# Patient Record
Sex: Female | Born: 1970 | Race: White | Hispanic: No | Marital: Married | Smoking: Light tobacco smoker
Health system: Southern US, Community
[De-identification: ages and names within clinical notes are randomized; demographics above are authoritative.]

## PROBLEM LIST (undated history)

## (undated) DIAGNOSIS — Z87442 Personal history of urinary calculi: Secondary | ICD-10-CM

## (undated) DIAGNOSIS — N289 Disorder of kidney and ureter, unspecified: Secondary | ICD-10-CM

## (undated) DIAGNOSIS — D0511 Intraductal carcinoma in situ of right breast: Secondary | ICD-10-CM

## (undated) DIAGNOSIS — E785 Hyperlipidemia, unspecified: Secondary | ICD-10-CM

## (undated) HISTORY — PX: WISDOM TOOTH EXTRACTION: SHX21

---

## 2018-07-24 ENCOUNTER — Other Ambulatory Visit: Payer: Self-pay

## 2018-07-24 ENCOUNTER — Ambulatory Visit: Payer: BLUE CROSS/BLUE SHIELD | Attending: Obstetrics and Gynecology

## 2018-07-24 DIAGNOSIS — E785 Hyperlipidemia, unspecified: Secondary | ICD-10-CM | POA: Insufficient documentation

## 2018-07-24 DIAGNOSIS — M62838 Other muscle spasm: Secondary | ICD-10-CM | POA: Diagnosis not present

## 2018-07-24 DIAGNOSIS — R293 Abnormal posture: Secondary | ICD-10-CM | POA: Insufficient documentation

## 2018-07-24 NOTE — Therapy (Signed)
[facility] [address] [place], [place], [address] Phone: [phone]   Fax:  [phone]  Physical Therapy Evaluation  The patient has been informed of current processes in place at Outpatient Rehab to protect patients from Covid-19 exposure including social distancing, schedule modifications, and new cleaning procedures. After discussing their particular risk with a therapist based on the patient's personal risk factors, the patient has decided to proceed with in-person therapy.   Patient Details  Name: Marisa Figueroa MRN: [id number] Date of Birth: 02/08/1970 No data recorded  Encounter Date: 07/24/2018    History reviewed. No pertinent past medical history.  History reviewed. No pertinent surgical history.  There were no vitals filed for this visit.     Pelvic Floor Physical Therapy Evaluation and Assessment  SCREENING  Falls in last 6 mo: no  Patient's communication preference:   Red Flags:  Have you had any night sweats? occasionally Unexplained weight loss? no Saddle anesthesia? no Unexplained changes in bowel or bladder habits? no  SUBJECTIVE  Patient reports: Having pain with intercourse for ~ 1 year since she entered menopause. Pain is both at opening and deeper.   Precautions:  none  Social/Family/Vocational History:   Massage therapist previously, is a trucker now.  Recent Procedures/Tests/Findings:  none  Obstetrical History: none  Gynecological History: Had an IUD, first time it went well, second time it did not go well, "everything went down hill from there"   Urinary History: Occasional UUI, mild SUI with prolonged coughing  Gastrointestinal History: No issues  Sexual activity/pain: Pain with both penetration and thrusting.   Location of pain: vaginal with intercourse  Current pain:  0/10  Max pain:  8/10 Least pain:  0/10 Nature of pain:  Sharp/ ache  Patient Goals: Be  able to have intercourse without pain.   OBJECTIVE  Posture/Observations:  Sitting:  Standing: L PSIS high, L hip high, L knee bent  Palpation/Segmental Motion/Joint Play: TTP to B hip-flexors, lumbar extensors, hip extensors, OI  Special tests:   Leg-length: LLE long by 1~cm  Range of Motion/Flexibilty:  Spine: L SB ~ 1.5 fingers from knee, pinch on L, To R: to knee, stretch on L. Decreased by ~ 25% B for rotation, has more feeling of tightness with L rotation. Hips:   Strength/MMT: Deferred to follow-up! LE MMT  LE MMT Left Right  Hip flex:  (L2) /5 /5  Hip ext: /5 /5  Hip abd: /5 /5  Hip add: /5 /5  Hip IR /5 /5  Hip ER /5 /5     Abdominal:  Palpation: TTP to B hip-flexors and adductors Diastasis: not assessed  Pelvic Floor External Exam: deferred to follow-up when deemed necessary Introitus Appears:  Skin integrity:  Palpation: Cough: Prolapse visible?: Scar mobility:  Internal Vaginal Exam: Strength (PERF):  Symmetry: Palpation: Prolapse:   Internal Rectal Exam: Strength (PERF): Symmetry: Palpation: Prolapse:   Gait Analysis: Deferred to follow-up  Pelvic Floor Outcome Measures: Given to Pt to return at visit #2  INTERVENTIONS THIS SESSION: Self-care: Educated on the structure and function of the pelvic floor in relation to their symptoms as well as the POC, and initial HEP in order to set patient expectations and understanding from which we will build on in the future sessions. G[place]eng heel-lift and education on how to gradually increase. Therex: Educated on and practiced hip-flexor stretch To maintain and improve muscle length and allow for improved balance of musculature for long-term symptom relief.  Total time: 60 min.                Objective measurements completed on examination: See above findings.                PT Short Term Goals - 07/26/18 0844      PT SHORT TERM GOAL #1   Title  Patient will  demonstrate improved pelvic alignment and balance of musculature surrounding the pelvis to facilitate decreased PFM spasms and decrease pelvic pain.    Baseline  LLE long, Spasms surrounding pelvis    Time  5    Period  Weeks    Status  New    Target Date  08/30/18      PT SHORT TERM GOAL #2   Title  Patient will demonstrate a coordinated contraction, relaxation, and bulge of the pelvic floor muscles to demonstrate functional recruitment and motion and allow for improved function.    Baseline  Pt. having pain with intercourse and UUI, indicating poor coordination and spasms through PFM    Time  5    Period  Weeks    Status  New    Target Date  08/30/18      PT SHORT TERM GOAL #3   Title  Patient will demonstrate HEP x1 in the clinic to demonstrate understanding and proper form to allow for further improvement.    Baseline  Pt. lacks understanding of therapeutic exercises that will decrease pain.    Time  5    Period  Weeks    Status  New    Target Date  08/30/18        PT Long Term Goals - 07/26/18 0848      PT LONG TERM GOAL #1   Title  Patient will report no episodes of SUI/UUI over the course of the prior two weeks to demonstrate improved functional ability.    Baseline  Pt. having mild leakage when she "runs" to the bathroom, SUI with prolonged coughing    Time  10    Period  Weeks    Status  New    Target Date  10/04/18      PT LONG TERM GOAL #2   Title  Patient will report no pain with intercourse to demonstrate improved functional ability    Baseline  Having pain with both initial penetration and deep thrusting    Time  10    Period  Weeks    Status  New    Target Date  10/04/18      PT LONG TERM GOAL #3   Title  Patient will score less than or equal to **% on the Female NIH-CPSI and ** on the VQ to demonstrate a reduction in pain, urinary symptoms, and an improved quality of life.    Baseline  To be updated at session 2    Time  10    Period  Weeks    Status   New    Target Date  10/04/18               Patient will benefit from skilled therapeutic intervention in order to improve the following deficits and impairments:  Decreased mobility, Hypomobility, Increased muscle spasms, Decreased activity tolerance, Increased fascial restricitons, Pain, Postural dysfunction  Visit Diagnosis: 1. Other muscle spasm   2. Abnormal posture        Problem List Patient Active Problem List   Diagnosis Date Noted  . Hyperlipemia 07/24/2018   Delfin EdisKeeli  Angeline Slim DPT, ATC Willa Rough 07/26/2018, 8:53 AM  [place] MAIN [facility] SERVICES [address] [place], [place], [address] Phone: [phone]   Fax:  [phone]  Name: Marisa Figueroa MRN: [id number] Date of Birth: 1970-06-24

## 2018-07-24 NOTE — Patient Instructions (Signed)
Flexors, Lunge  Hip Flexor Stretch: Proposal Pose    Maintain pelvic tuck under, lift pubic bone toward navel. Engage posterior hip muscles (firm glute muscles of leg in back position) and shift forward until you feel stretch on front of leg that is down. To increase stretch, maintain balance and ease hips forward. You may use one hand on a chair for balance if needed. Hold for __5__ breaths. Repeat __2-3__ times each leg.  Do _1-2__ times per day.   As you start wearing your heel-lift only wear it for an hour the first day and increase by an hour each day so you can allow for the body to adapt to the change easily without much pain. If your pain increases by more than 1-2 points, back off slightly or slow down how quickly you increase your wear time. Once you reach a full day of wear, use it as much as possible forever, even in house-shoes or flip-flops if necessary to keep yourself from reverting to bad pelvic and spinal alignment and having symptoms return.    Adjust-a-lift heel lift Can be found at [email or web address]

## 2018-07-31 ENCOUNTER — Ambulatory Visit: Payer: BLUE CROSS/BLUE SHIELD

## 2018-07-31 ENCOUNTER — Other Ambulatory Visit: Payer: Self-pay

## 2018-07-31 DIAGNOSIS — R293 Abnormal posture: Secondary | ICD-10-CM

## 2018-07-31 DIAGNOSIS — M62838 Other muscle spasm: Secondary | ICD-10-CM

## 2018-07-31 NOTE — Therapy (Signed)
[facility] [address] [place], [place], [address] Phone: [phone]   Fax:  [phone]  Physical Therapy Treatment  The patient has been informed of current processes in place at Outpatient Rehab to protect patients from Covid-19 exposure including social distancing, schedule modifications, and new cleaning procedures. After discussing their particular risk with a therapist based on the patient's personal risk factors, the patient has decided to proceed with in-person therapy.   Patient Details  Name: Marisa Figueroa MRN: [id number] Date of Birth: 06/20/1970 No data recorded  Encounter Date: 07/31/2018  PT End of Session - 08/01/18 1106    Visit Number  2    Number of Visits  10    Date for PT Re-Evaluation  10/02/18    Authorization Type  BCBS high-deductible    Authorization Time Period  10/02/2018    Authorization - Visit Number  2    Authorization - Number of Visits  10    PT Start Time  1530    PT Stop Time  1630    PT Time Calculation (min)  60 min    Activity Tolerance  Patient tolerated treatment well;No increased pain;Other (comment)   Pt. became teary when discussing sexual function   Behavior During Therapy  [facility] for tasks assessed/performed       No past medical history on file.  No past surgical history on file.  There were no vitals filed for this visit.    Pelvic Floor Physical Therapy Treatment Note  SCREENING  Changes in medications, allergies, or medical history?: none    SUBJECTIVE  Patient reports: Has not tried intercourse. Wore heel-lift for ~ 30 min. Each day.  Precautions:  none  Pain update:  Location of pain: Vaginal with intercourse Current pain:  0/10  Max pain:  8/10 Least pain:  0/10 Nature of pain: sharp/ache  Patient Goals: Be able to have intercourse without pain.   OBJECTIVE  Changes in: Posture/Observations:  Hyperlordotic  Abdominal:  Weak  TA  Palpation: TTP to B hip-flexors, L>R  Gait Analysis: Decreased hip EXT B  INTERVENTIONS THIS SESSION: Self-care: reviewed how heel-lift should be worn to see improvement. Therex: educated on and practiced pelvic tilts in seated to increase recruitment and strength of TA to decrease hip-flexor spasm and improve deep-core stability. Manual: Performed TP release to B Iliacus and Psoas to decrease spasm and pain and allow for improved balance of musculature for improved function and decreased symptoms.   Total time: 60 min.                            PT Short Term Goals - 07/26/18 0844      PT SHORT TERM GOAL #1   Title  Patient will demonstrate improved pelvic alignment and balance of musculature surrounding the pelvis to facilitate decreased PFM spasms and decrease pelvic pain.    Baseline  LLE long, Spasms surrounding pelvis    Time  5    Period  Weeks    Status  New    Target Date  08/30/18      PT SHORT TERM GOAL #2   Title  Patient will demonstrate a coordinated contraction, relaxation, and bulge of the pelvic floor muscles to demonstrate functional recruitment and motion and allow for improved function.    Baseline  Pt. having pain with intercourse and UUI, indicating poor coordination and spasms through PFM  Time  5    Period  Weeks    Status  New    Target Date  08/30/18      PT SHORT TERM GOAL #3   Title  Patient will demonstrate HEP x1 in the clinic to demonstrate understanding and proper form to allow for further improvement.    Baseline  Pt. lacks understanding of therapeutic exercises that will decrease pain.    Time  5    Period  Weeks    Status  New    Target Date  08/30/18        PT Long Term Goals - 07/26/18 0848      PT LONG TERM GOAL #1   Title  Patient will report no episodes of SUI/UUI over the course of the prior two weeks to demonstrate improved functional ability.    Baseline  Pt. having mild leakage when she "runs"  to the bathroom, SUI with prolonged coughing    Time  10    Period  Weeks    Status  New    Target Date  10/04/18      PT LONG TERM GOAL #2   Title  Patient will report no pain with intercourse to demonstrate improved functional ability    Baseline  Having pain with both initial penetration and deep thrusting    Time  10    Period  Weeks    Status  New    Target Date  10/04/18      PT LONG TERM GOAL #3   Title  Patient will score less than or equal to **% on the Female NIH-CPSI and ** on the VQ to demonstrate a reduction in pain, urinary symptoms, and an improved quality of life.    Baseline  To be updated at session 2    Time  10    Period  Weeks    Status  New    Target Date  10/04/18            Plan - 08/01/18 1107    Clinical Impression Statement  Pt. Responded well though slowly to all interventions today, demonstrating improved hip-flexor EXT ROM and decreased spasm as well as understanding and correct performance of all education and exercises provided today. They will continue to benefit from skilled physical therapy to work toward remaining goals and maximize function as well as decrease likelihood of symptom increase or recurrence.     PT Next Visit Plan  Perform TP release vs. DN to Low back, L QL and mobility to sacrum    PT Home Exercise Plan  hip-flexor stretch, Pelvic tilts in seated    Consulted and Agree with Plan of Care  Patient       Patient will benefit from skilled therapeutic intervention in order to improve the following deficits and impairments:     Visit Diagnosis: 1. Other muscle spasm   2. Abnormal posture        Problem List Patient Active Problem List   Diagnosis Date Noted  . Hyperlipemia 07/24/2018   Willa Rough DPT, ATC Willa Rough 08/01/2018, 11:08 AM  [place] MAIN [facility] SERVICES [address] [place], [place], [address] Phone: [phone]   Fax:  [phone]  Name: Marisa Figueroa MRN: [id number] Date of Birth: [birth year]

## 2018-07-31 NOTE — Patient Instructions (Signed)
   Do 2 sets of 15 tilts per day. Breathe in when you tilt forward (A) and out when you tuck under (B).  

## 2018-08-07 ENCOUNTER — Other Ambulatory Visit: Payer: Self-pay

## 2018-08-07 ENCOUNTER — Ambulatory Visit: Payer: BLUE CROSS/BLUE SHIELD

## 2018-08-07 DIAGNOSIS — M62838 Other muscle spasm: Secondary | ICD-10-CM | POA: Diagnosis not present

## 2018-08-07 DIAGNOSIS — R293 Abnormal posture: Secondary | ICD-10-CM

## 2018-08-07 NOTE — Patient Instructions (Signed)
[facility]  Marriage counselor in [place], [place]   Online appointments  Address: [place], [place], [place] [address]  Hours:  Open ? Closes 7PM  Updated by business 2 weeks ago   Phone: [phone]  Also look on psychology today website    Relax all the way forward, tucking your hips under just slightly so you feel a stretch through your low back. Hold for 5 deep breaths, repeat 2-3 times, 1-2 times per day.

## 2018-08-07 NOTE — Therapy (Signed)
[place] MAIN [facility] SERVICES [address] [place], [place], [address] Phone: [phone]   Fax:  [phone]  Physical Therapy Treatment  The patient has been informed of current processes in place at Outpatient Rehab to protect patients from Covid-19 exposure including social distancing, schedule modifications, and new cleaning procedures. After discussing their particular risk with a therapist based on the patient's personal risk factors, the patient has decided to proceed with in-person therapy.   Patient Details  Name: Marisa Figueroa MRN: [id number] Date of Birth: [birth year] No data recorded  Encounter Date: 08/07/2018  PT End of Session - 08/07/18 1625    Visit Number  3    Number of Visits  10    Date for PT Re-Evaluation  10/02/18    Authorization Type  BCBS high-deductible    Authorization Time Period  10/02/2018    Authorization - Visit Number  3    Authorization - Number of Visits  10    PT Start Time  4580    PT Stop Time  1630    PT Time Calculation (min)  60 min    Activity Tolerance  Patient tolerated treatment well;No increased pain;Other (comment)   Pt. became teary when discussing sexual function   Behavior During Therapy  [facility] for tasks assessed/performed       No past medical history on file.  No past surgical history on file.  There were no vitals filed for this visit.    Pelvic Floor Physical Therapy Treatment Note  SCREENING  Changes in medications, allergies, or medical history?: none    SUBJECTIVE  Patient reports: Is enjoying the pelvic tilts but has been still having hip tightness with stretches.   Precautions:  none  Pain update:  Location of pain: Vaginal with intercourse Current pain: 0/10  Max pain: 8/10 Least pain: 0/10 Nature of pain:sharp/ache  Patient Goals: Be able to have intercourse without pain.   OBJECTIVE  Changes in: Posture/Observations:  Pt. Became teary when  discussing the  impact of emotions on PFM dysfunction/vaginismus    Palpation: TTP to B Iliacus and lumbar paraspinals, multifidus and L>R QL.  Gait Analysis: Decreased stride length, anterior pelvic tilt.  INTERVENTIONS THIS SESSION: Self-care: Educated on the Female arousal cycle and how tied into our mental state it is. Encouraged Pt. To seek mental health counseling to help her address the mental/emotional aspect of her dyspareunia as we are addressing the physical aspect. Manual: Performed TP release and STM to B lumbar paraspinals to decrease spasm and pain and allow for improved balance of musculature for improved function and decreased symptoms. Dry-needle: Performed TPDN with .30x61mm needle and standard approach as described below to decrease spasm and pain and allow for improved balance of musculature for improved function and decreased symptoms. Therex: Educated on and practiced low back stretch To maintain and improve muscle length and allow for improved balance of musculature for long-term symptom relief.  Total time: 60 min.                    Trigger Point Dry Needling - 08/07/18 0001    Consent Given?  Yes    Education Handout Provided  No    Muscles Treated Back/Hip  Erector spinae;Lumbar multifidi    Dry Needling Comments  Bilateral    Erector spinae Response  Twitch response elicited;Palpable increased muscle length    Lumbar multifidi Response  Twitch response elicited;Palpable increased muscle length  PT Short Term Goals - 07/26/18 0844      PT SHORT TERM GOAL #1   Title  Patient will demonstrate improved pelvic alignment and balance of musculature surrounding the pelvis to facilitate decreased PFM spasms and decrease pelvic pain.    Baseline  LLE long, Spasms surrounding pelvis    Time  5    Period  Weeks    Status  New    Target Date  08/30/18      PT SHORT TERM GOAL #2   Title  Patient will demonstrate a coordinated  contraction, relaxation, and bulge of the pelvic floor muscles to demonstrate functional recruitment and motion and allow for improved function.    Baseline  Pt. having pain with intercourse and UUI, indicating poor coordination and spasms through PFM    Time  5    Period  Weeks    Status  New    Target Date  08/30/18      PT SHORT TERM GOAL #3   Title  Patient will demonstrate HEP x1 in the clinic to demonstrate understanding and proper form to allow for further improvement.    Baseline  Pt. lacks understanding of therapeutic exercises that will decrease pain.    Time  5    Period  Weeks    Status  New    Target Date  08/30/18        PT Long Term Goals - 07/26/18 0848      PT LONG TERM GOAL #1   Title  Patient will report no episodes of SUI/UUI over the course of the prior two weeks to demonstrate improved functional ability.    Baseline  Pt. having mild leakage when she "runs" to the bathroom, SUI with prolonged coughing    Time  10    Period  Weeks    Status  New    Target Date  10/04/18      PT LONG TERM GOAL #2   Title  Patient will report no pain with intercourse to demonstrate improved functional ability    Baseline  Having pain with both initial penetration and deep thrusting    Time  10    Period  Weeks    Status  New    Target Date  10/04/18      PT LONG TERM GOAL #3   Title  Patient will score less than or equal to **% on the Female NIH-CPSI and ** on the VQ to demonstrate a reduction in pain, urinary symptoms, and an improved quality of life.    Baseline  To be updated at session 2    Time  10    Period  Weeks    Status  New    Target Date  10/04/18            Plan - 08/07/18 1626    Clinical Impression Statement  Pt. Responded well to all interventions today, demonstrating improved relaxation of lumbar multifidus, with Pt. Report of feeling relaxation into lower abdomen and pelvis as well as understanding and correct performance of all education and  exercises provided today. They will continue to benefit from skilled physical therapy to work toward remaining goals and maximize function as well as decrease likelihood of symptom increase or recurrence.     PT Next Visit Plan  Perform TP release vs. DN to B hip-flexors and adductors, L QL and mobility to sacrum    PT Home Exercise Plan  hip-flexor stretch, Pelvic tilts in  seated, LB stretch    Recommended Other Services  Sex Counselling    Consulted and Agree with Plan of Care  Patient       Patient will benefit from skilled therapeutic intervention in order to improve the following deficits and impairments:     Visit Diagnosis: 1. Other muscle spasm   2. Abnormal posture        Problem List Patient Active Problem List   Diagnosis Date Noted  . Hyperlipemia 07/24/2018   Cleophus MoltKeeli T. Gailes DPT, ATC Cleophus MoltKeeli T Gailes 08/07/2018, 5:04 PM  [place] [facility] [address] [place], [place], [address] Phone: [phone]   Fax:  [phone]  Name: Marisa GrandchildStephanie Figueroa MRN: [id number] Date of Birth: 09/17/1970

## 2018-08-14 ENCOUNTER — Ambulatory Visit: Payer: BLUE CROSS/BLUE SHIELD | Attending: Obstetrics and Gynecology

## 2018-08-14 ENCOUNTER — Other Ambulatory Visit: Payer: Self-pay

## 2018-08-14 DIAGNOSIS — M62838 Other muscle spasm: Secondary | ICD-10-CM | POA: Diagnosis not present

## 2018-08-14 DIAGNOSIS — R293 Abnormal posture: Secondary | ICD-10-CM

## 2018-08-14 NOTE — Patient Instructions (Signed)
   Let the top arm rest on your side (different from pictured) , and slide along the torso as you rotate. Breathe in as you come forward, out as you open up. Do 2x15 on each side.

## 2018-08-14 NOTE — Therapy (Signed)
[place] MAIN [facility] SERVICES [address] [place], [place], [address] Phone: [phone]   Fax:  [phone]  Physical Therapy Treatment  The patient has been informed of current processes in place at Outpatient Rehab to protect patients from Covid-19 exposure including social distancing, schedule modifications, and new cleaning procedures. After discussing their particular risk with a therapist based on the patient's personal risk factors, the patient has decided to proceed with in-person therapy.   Patient Details  Name: Cullen Vanallen MRN: [id number] Date of Birth: [birth year] No data recorded  Encounter Date: 08/14/2018  PT End of Session - 08/15/18 0814    Visit Number  4    Number of Visits  10    Date for PT Re-Evaluation  10/02/18    Authorization Type  BCBS high-deductible    Authorization Time Period  10/02/2018    Authorization - Visit Number  4    Authorization - Number of Visits  10    PT Start Time  1950    PT Stop Time  1630    PT Time Calculation (min)  60 min    Activity Tolerance  Patient tolerated treatment well;No increased pain;Other (comment)   Pt. became teary when discussing sexual function   Behavior During Therapy  [facility] for tasks assessed/performed       No past medical history on file.  No past surgical history on file.  There were no vitals filed for this visit.     Pelvic Floor Physical Therapy Treatment Note  SCREENING  Changes in medications, allergies, or medical history?: none    SUBJECTIVE  Patient reports: Is doing well this week, has been doing stretches, is wanting to add yoga to her practice. Recognizing that self-care is very important and she has to do it. Has not looked into the resources yet about counselling.   Precautions:  none  Pain update:  Location of pain: Vaginal with intercourse Current pain: 0/10  Max pain: 8/10 Least pain: 0/10 Nature of pain:sharp/ache  Patient  Goals: Be able to have intercourse without pain.   OBJECTIVE  Changes in: Posture/Observations:  Decreased rot ROM B by ~ 25%, with pain on R>L with rot.   Palpation: TTP to B Iliacus and Psoas.   INTERVENTIONS THIS SESSION: Manual: Performed TP release and STM to B Iliacus and Psoas to decrease spasm and pain and allow for improved balance of musculature for improved function and decreased symptoms. Dry-needle: Performed TPDN with .30x45mm needle and standard approach as described below to decrease spasm and pain and allow for improved balance of musculature for improved function and decreased symptoms. Therex: Educated on and practiced bow-and-arrow to improve mobility of spine and surrounding connective tissue and decrease pressure on nerve roots for improved conductivity and function of down-stream tissues.   Total time: 60 min.                         Trigger Point Dry Needling - 08/15/18 0001    Consent Given?  Yes    Education Handout Provided  No    Muscles Treated Back/Hip  Iliacus    Dry Needling Comments  Bilateral    Iliacus Response  Twitch response elicited;Palpable increased muscle length             PT Short Term Goals - 07/26/18 0844      PT SHORT TERM GOAL #1   Title  Patient will demonstrate improved pelvic  alignment and balance of musculature surrounding the pelvis to facilitate decreased PFM spasms and decrease pelvic pain.    Baseline  LLE long, Spasms surrounding pelvis    Time  5    Period  Weeks    Status  New    Target Date  08/30/18      PT SHORT TERM GOAL #2   Title  Patient will demonstrate a coordinated contraction, relaxation, and bulge of the pelvic floor muscles to demonstrate functional recruitment and motion and allow for improved function.    Baseline  Pt. having pain with intercourse and UUI, indicating poor coordination and spasms through PFM    Time  5    Period  Weeks    Status  New    Target Date   08/30/18      PT SHORT TERM GOAL #3   Title  Patient will demonstrate HEP x1 in the clinic to demonstrate understanding and proper form to allow for further improvement.    Baseline  Pt. lacks understanding of therapeutic exercises that will decrease pain.    Time  5    Period  Weeks    Status  New    Target Date  08/30/18        PT Long Term Goals - 07/26/18 0848      PT LONG TERM GOAL #1   Title  Patient will report no episodes of SUI/UUI over the course of the prior two weeks to demonstrate improved functional ability.    Baseline  Pt. having mild leakage when she "runs" to the bathroom, SUI with prolonged coughing    Time  10    Period  Weeks    Status  New    Target Date  10/04/18      PT LONG TERM GOAL #2   Title  Patient will report no pain with intercourse to demonstrate improved functional ability    Baseline  Having pain with both initial penetration and deep thrusting    Time  10    Period  Weeks    Status  New    Target Date  10/04/18      PT LONG TERM GOAL #3   Title  Patient will score less than or equal to **% on the Female NIH-CPSI and ** on the VQ to demonstrate a reduction in pain, urinary symptoms, and an improved quality of life.    Baseline  To be updated at session 2    Time  10    Period  Weeks    Status  New    Target Date  10/04/18            Plan - 08/15/18 0815    Clinical Impression Statement  Pt. Responded well to all interventions today, demonstrating improved upright posture, hip EXT ROM, and decreased spasm as well as understanding and correct performance of all education and exercises provided today. They will continue to benefit from skilled physical therapy to work toward remaining goals and maximize function as well as decrease likelihood of symptom increase or recurrence.    PT Next Visit Plan  Perform TP release vs. DN to B Psoas? and adductors, L QL and mobility to sacrum    PT Home Exercise Plan  hip-flexor stretch, Pelvic  tilts in seated, LB stretch, bow-and-arrow    Consulted and Agree with Plan of Care  Patient       Patient will benefit from skilled therapeutic intervention in order to improve the  following deficits and impairments:     Visit Diagnosis: 1. Other muscle spasm   2. Abnormal posture        Problem List Patient Active Problem List   Diagnosis Date Noted  . Hyperlipemia 07/24/2018   Cleophus MoltKeeli T. Mikyla Schachter DPT, ATC Cleophus MoltKeeli T Gurfateh Mcclain 08/15/2018, 8:17 AM  [place] [facility] [address] [place], [place], [address] Phone: [phone]   Fax:  [phone]  Name: Etta GrandchildStephanie Birr MRN: [id number] Date of Birth: 06/04/1970

## 2018-08-21 ENCOUNTER — Ambulatory Visit: Payer: BLUE CROSS/BLUE SHIELD

## 2018-08-22 ENCOUNTER — Ambulatory Visit: Payer: BLUE CROSS/BLUE SHIELD

## 2018-08-22 ENCOUNTER — Other Ambulatory Visit: Payer: Self-pay

## 2018-08-22 DIAGNOSIS — M62838 Other muscle spasm: Secondary | ICD-10-CM | POA: Diagnosis not present

## 2018-08-22 DIAGNOSIS — R293 Abnormal posture: Secondary | ICD-10-CM

## 2018-08-22 NOTE — Therapy (Signed)
[facility] [address] [place], [place], [address] Phone: [phone]   Fax:  [phone]  Physical Therapy Treatment   The patient has been informed of current processes in place at Outpatient Rehab to protect patients from Covid-19 exposure including social distancing, schedule modifications, and new cleaning procedures. After discussing their particular risk with a therapist based on the patient's personal risk factors, the patient has decided to proceed with in-person therapy.   Patient Details  Name: Marisa Figueroa MRN: [id number] Date of Birth: 01/30/1970 No data recorded  Encounter Date: 08/22/2018  PT End of Session - 08/23/18 0817    Visit Number  5    Number of Visits  10    Date for PT Re-Evaluation  10/02/18    Authorization Type  BCBS high-deductible    Authorization Time Period  10/02/2018    Authorization - Visit Number  5    Authorization - Number of Visits  10    PT Start Time  1735    PT Stop Time  1835    PT Time Calculation (min)  60 min    Activity Tolerance  Patient tolerated treatment well;Other (comment)   Pt. became teary when discussing sexual function   Behavior During Therapy  [facility] for tasks assessed/performed       No past medical history on file.  No past surgical history on file.  There were no vitals filed for this visit.    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 She has been working a lot lately.  Precautions:  none  Pain update:  Location of pain: Vaginal with intercourse back/sides Current pain: 6/10  Max pain: 8/10 Least pain: 0/10 Nature of pain:sharp/ache  Patient Goals: Be able to have intercourse without pain.   OBJECTIVE  Changes in:   Pelvic  Floor External Exam: Introitus Appears: elevated Skin integrity: WNL Palpation: TTP through R bulbo, B STP and IC. Cough: no motion Prolapse visible?: no Scar mobility: N/A  Internal Vaginal Exam: Strength (PERF): not assessed Symmetry: R>L for TTP Palpation: TTP throughout Prolapse: none     INTERVENTIONS THIS SESSION: Manual: assessed PFM and educated husband on how to perfomr internal TP release and Pt. On how to perform self external TP release to decrease spasm and pain and allow for improved balance of musculature for improved function and decreased symptoms. Self-care: educated both Pt. And her husband on the multiple systems that are involved with the heightened spasm/pain and sensitivity of the pelvis and how to use a multi-pronged approach to treat all aspects. Educated on awakenings sexual therapy in [place].  Total time: 60 min.                             PT Short Term Goals - 07/26/18 0844      PT SHORT TERM GOAL #1   Title  Patient will demonstrate improved pelvic alignment and balance of musculature surrounding the pelvis to facilitate decreased PFM spasms and decrease pelvic pain.    Baseline  LLE long, Spasms surrounding pelvis    Time  5    Period  Weeks    Status  New    Target Date  08/30/18  PT SHORT TERM GOAL #2   Title  Patient will demonstrate a coordinated contraction, relaxation, and bulge of the pelvic floor muscles to demonstrate functional recruitment and motion and allow for improved function.    Baseline  Pt. having pain with intercourse and UUI, indicating poor coordination and spasms through PFM    Time  5    Period  Weeks    Status  New    Target Date  08/30/18      PT SHORT TERM GOAL #3   Title  Patient will demonstrate HEP x1 in the clinic to demonstrate understanding and proper form to allow for further improvement.    Baseline  Pt. lacks understanding of therapeutic exercises that will decrease pain.     Time  5    Period  Weeks    Status  New    Target Date  08/30/18        PT Long Term Goals - 07/26/18 0848      PT LONG TERM GOAL #1   Title  Patient will report no episodes of SUI/UUI over the course of the prior two weeks to demonstrate improved functional ability.    Baseline  Pt. having mild leakage when she "runs" to the bathroom, SUI with prolonged coughing    Time  10    Period  Weeks    Status  New    Target Date  10/04/18      PT LONG TERM GOAL #2   Title  Patient will report no pain with intercourse to demonstrate improved functional ability    Baseline  Having pain with both initial penetration and deep thrusting    Time  10    Period  Weeks    Status  New    Target Date  10/04/18      PT LONG TERM GOAL #3   Title  Patient will score less than or equal to **% on the Female NIH-CPSI and ** on the VQ to demonstrate a reduction in pain, urinary symptoms, and an improved quality of life.    Baseline  To be updated at session 2    Time  10    Period  Weeks    Status  New    Target Date  10/04/18            Plan - 08/23/18 0818    Clinical Impression Statement  Pt. Responded well to all interventions today, demonstrating understanding of all education and ability to use breathing to help decrease and moderate pain/fear response with digital penetration for exam and TP release training as well as improved ability to communicate effectively with her husband in the context of him assisting her with her treatment. They will continue to benefit from skilled physical therapy to work toward remaining goals and maximize function as well as decrease likelihood of symptom increase or recurrence.     PT Next Visit Plan  Perform TP release vs. DN to B Psoas? and L adductors, L QL and mobility to sacrum Add TA in quad    PT Home Exercise Plan  hip-flexor stretch, Pelvic tilts in seated, LB stretch, bow-and-arrow    Recommended Other Services  sex Counselling    Consulted and  Agree with Plan of Care  Patient       Patient will benefit from skilled therapeutic intervention in order to improve the following deficits and impairments:     Visit Diagnosis: 1. Other muscle spasm   2. Abnormal posture  Problem List Patient Active Problem List   Diagnosis Date Noted  . Hyperlipemia 07/24/2018   Cleophus MoltKeeli T. Antonea Gaut DPT, ATC Cleophus MoltKeeli T Janit Cutter 08/23/2018, 8:21 AM  [facility] [address] [place], [place], [address] Phone: [phone]   Fax:  [phone]  Name: Marisa Figueroa MRN: [id number] Date of Birth: 01/30/1970

## 2018-08-22 NOTE — Patient Instructions (Signed)
Self External Trigger Point Relief    1) Wash your hands and prop yourself up in a way where you can easily reach the vagina. You may wish to have a small hand-held mirror near by.  2) Use the 2 middle fingers to put gentle pressure on the three external pelvic floor muscles and hold pressure and take deep breaths as you allow the tension to release and discomfort to dissipate   3) Repeat the process for any trigger points you find spending between 3-10 minutes on this every 1-2 days until you do not find any more trigger points or you are told otherwise by your therapist.  Partner Internal Trigger Point Relief     1) Wash your hands and position in a way where you can easily reach the vagina. You may wish to sit beside your partner and let them relax their leg onto you.   2) lubricate the index finger using a hypoallergenic lubricant such as "slippery-stuff".   3) Slowly and gently insert your finger into the vagina using deep breaths to allow relaxation of the muscles around the finger.  4) Avoiding the "12 o-clock" region near the urethra, gently press into the the wall of the pelvic floor and hold pressure until you find an area that is tender, use picture above to think about where different muscle fibers are.  5) Areas that are tender are called "trigger points". When you find one hold pressure still, applying just enough pressure to elicit mild discomfort and allow your partner to take deep belly breaths until the discomfort subsides or decreases by at least 50%. After the first point releases, release pressure before moving the finger, then move to different areas of the pelvic floor and feel for other trigger points.   6) Repeat the process for any trigger points you find spending between 3-10 minutes on this per night until you do not find any more trigger points or you are told otherwise by your therapist.

## 2018-08-28 ENCOUNTER — Ambulatory Visit: Payer: BLUE CROSS/BLUE SHIELD

## 2018-09-04 ENCOUNTER — Ambulatory Visit: Payer: BLUE CROSS/BLUE SHIELD

## 2018-09-04 ENCOUNTER — Other Ambulatory Visit: Payer: Self-pay

## 2018-09-04 DIAGNOSIS — M62838 Other muscle spasm: Secondary | ICD-10-CM

## 2018-09-04 DIAGNOSIS — R293 Abnormal posture: Secondary | ICD-10-CM

## 2018-09-04 NOTE — Patient Instructions (Signed)
    Sit at the end of the foam roller or towel roll and lie back with your spine along the length of it. Bring your arms out to the side, palms up, and take _10__ belly breaths.  Next, slide arms up overhead doing a "snow angel" motion as you breathe in and down toward your hips as you breathe out. Do this for _10__ belly breaths.     Swing the leg back at an angle, feeling the firm end feel at your low back/hip joint when you get to the back do 2x15 on the Right, 1x15 on the Left   Exhale and rotate knee out, squeezing in the outer hip. Do 2x15  On both sides.

## 2018-09-04 NOTE — Therapy (Signed)
[facility] [address] [place], [place], [address] Phone: [phone]   Fax:  [phone]  Physical Therapy Treatment  The patient has been informed of current processes in place at Outpatient Rehab to protect patients from Covid-19 exposure including social distancing, schedule modifications, and new cleaning procedures. After discussing their particular risk with a therapist based on the patient's personal risk factors, the patient has decided to proceed with in-person therapy.   Patient Details  Name: Marisa GrandchildStephanie Figueroa MRN: [id number] Date of Birth: 09/01/1970 No data recorded  Encounter Date: 09/04/2018  PT End of Session - 09/04/18 1836    Visit Number  6    Number of Visits  10    Date for PT Re-Evaluation  10/02/18    Authorization Type  BCBS high-deductible    Authorization Time Period  10/02/2018    Authorization - Visit Number  6    Authorization - Number of Visits  10    PT Start Time  1530    PT Stop Time  1630    PT Time Calculation (min)  60 min    Activity Tolerance  Patient tolerated treatment well;Other (comment)   Pt. became teary when discussing sexual function   Behavior During Therapy  [facility] for tasks assessed/performed       No past medical history on file.  No past surgical history on file.  There were no vitals filed for this visit.    Pelvic Floor Physical Therapy Treatment Note  SCREENING  Changes in medications, allergies, or medical history?: none    SUBJECTIVE  Patient reports: Has been really stressed lately, is going to start going to acupuncturist and reiki again and get a pedicure this week. Has not had time to do internal TP release yet with husband. Has not done a lot of exercise lately, has been working on getting her gym area cleaned up. Is going to call the counselor, at least for herself.   Precautions:  none  Pain update:  Location of pain: Vaginal with intercourse  back/sides Current pain: 6/10  Max pain: 8/10 Least pain: 0/10 Nature of pain:sharp/ache  Patient Goals: Be able to have intercourse without pain.   OBJECTIVE  Changes in:   Mobility/ROM: Decreased sacral mobility B and pain radiating up to back and lateral and down during mobs.   Resolved on L, partial improvement on R.   INTERVENTIONS THIS SESSION: Manual: Performed PA mobs to B sacral borders to improve mobility of joint and surrounding connective tissue and decrease pressure on nerve roots for improved conductivity and function of down-stream tissues.  Therex: educated on and practiced clam-shells  And leg-swings to improve sacral mobility and decrease pressure on nerve roots for  Decreased PFM and back spasms. Educated on chest stretch and snow-angels on foam roller to improve upper posture and create positive input through the spine for improved proprioception and overall posture. Self-care: supported Pt. Decision to seek counseling and reiterated the importance of taking care of herself first and being a positive role model for her husband without being reliant on him for her health improvement to allow Pt. To make progress.  Total time: 60 min.                                 PT Short Term Goals - 07/26/18 0844      PT SHORT TERM GOAL #1  Title  Patient will demonstrate improved pelvic alignment and balance of musculature surrounding the pelvis to facilitate decreased PFM spasms and decrease pelvic pain.    Baseline  LLE long, Spasms surrounding pelvis    Time  5    Period  Weeks    Status  New    Target Date  08/30/18      PT SHORT TERM GOAL #2   Title  Patient will demonstrate a coordinated contraction, relaxation, and bulge of the pelvic floor muscles to demonstrate functional recruitment and motion and allow for improved function.    Baseline  Pt. having pain with intercourse and UUI, indicating poor coordination and spasms  through PFM    Time  5    Period  Weeks    Status  New    Target Date  08/30/18      PT SHORT TERM GOAL #3   Title  Patient will demonstrate HEP x1 in the clinic to demonstrate understanding and proper form to allow for further improvement.    Baseline  Pt. lacks understanding of therapeutic exercises that will decrease pain.    Time  5    Period  Weeks    Status  New    Target Date  08/30/18        PT Long Term Goals - 07/26/18 0848      PT LONG TERM GOAL #1   Title  Patient will report no episodes of SUI/UUI over the course of the prior two weeks to demonstrate improved functional ability.    Baseline  Pt. having mild leakage when she "runs" to the bathroom, SUI with prolonged coughing    Time  10    Period  Weeks    Status  New    Target Date  10/04/18      PT LONG TERM GOAL #2   Title  Patient will report no pain with intercourse to demonstrate improved functional ability    Baseline  Having pain with both initial penetration and deep thrusting    Time  10    Period  Weeks    Status  New    Target Date  10/04/18      PT LONG TERM GOAL #3   Title  Patient will score less than or equal to **% on the Female NIH-CPSI and ** on the VQ to demonstrate a reduction in pain, urinary symptoms, and an improved quality of life.    Baseline  To be updated at session 2    Time  10    Period  Weeks    Status  New    Target Date  10/04/18            Plan - 09/04/18 1837    Clinical Impression Statement  Pt. Responded well to all interventions today, demonstrating improved sacral mobility as well as understanding and correct performance of all education and exercises provided today. They will continue to benefit from skilled physical therapy to work toward remaining goals and maximize function as well as decrease likelihood of symptom increase or recurrence.     PT Next Visit Plan  Perform TP release vs. DN to B Psoas? and L adductors, L QL and mobility to R sacrum?  Add TA in  quad    PT Home Exercise Plan  hip-flexor stretch, Pelvic tilts in seated, LB stretch, bow-and-arrow, leg-swings, clam-shells    Consulted and Agree with Plan of Care  Patient       Patient  will benefit from skilled therapeutic intervention in order to improve the following deficits and impairments:     Visit Diagnosis: Other muscle spasm  Abnormal posture     Problem List Patient Active Problem List   Diagnosis Date Noted  . Hyperlipemia 07/24/2018   Cleophus Molt DPT, ATC Cleophus Molt 09/04/2018, 6:38 PM  [facility] MAIN [facility] SERVICES [address] [place], [place], [address] Phone: [phone]   Fax:  [phone]  Name: Marisa Figueroa MRN: [id number] Date of Birth: 25-Nov-1970

## 2018-09-11 ENCOUNTER — Ambulatory Visit: Payer: BLUE CROSS/BLUE SHIELD

## 2018-09-18 ENCOUNTER — Other Ambulatory Visit: Payer: Self-pay

## 2018-09-18 ENCOUNTER — Ambulatory Visit: Payer: BLUE CROSS/BLUE SHIELD | Attending: Obstetrics and Gynecology

## 2018-09-18 DIAGNOSIS — M62838 Other muscle spasm: Secondary | ICD-10-CM | POA: Diagnosis present

## 2018-09-18 DIAGNOSIS — R293 Abnormal posture: Secondary | ICD-10-CM | POA: Insufficient documentation

## 2018-09-18 NOTE — Therapy (Signed)
[place] [facility]IN [facility] SERVICES [address] [place], [place], [address] Phone: [phone]   Fax:  [phone]  Physical Therapy Treatment  The patient has been informed of current processes in place at Outpatient Rehab to protect patients from Covid-19 exposure including social distancing, schedule modifications, and new cleaning procedures. After discussing their particular risk with a therapist based on the patient's personal risk factors, the patient has decided to proceed with in-person therapy.   Patient Details  Name: Marisa Figueroa MRN: [id number] Date of Birth: 1970-09-19 No data recorded  Encounter Date: 09/18/2018  PT End of Session - 09/18/18 1636    Visit Number  7    Number of Visits  10    Date for PT Re-Evaluation  10/02/18    Authorization Type  [employer] high-deductible    Authorization Time Period  10/02/2018    Authorization - Visit Number  7    Authorization - Number of Visits  10    PT Start Time  6962    PT Stop Time  1628    PT Time Calculation (min)  45 min    Activity Tolerance  Patient tolerated treatment well   Pt. became teary when discussing sexual function   Behavior During Therapy  [facility] for tasks assessed/performed       No past medical history on file.  No past surgical history on file.  There were no vitals filed for this visit.   Pelvic Floor Physical Therapy Treatment Note  SCREENING  Changes in medications, allergies, or medical history?: none    SUBJECTIVE  Patient reports: Has had a good week, has done rieki, acupuncture, some stretches, talked to her pastor who also talked to her husband. Able to touch herself externally without pain.  Precautions:  none  Pain update:  Location of pain: Vaginal with intercourse back/sides Current pain: 4/10  Max pain: 4/10 Least pain: 0/10 Nature of pain:sharp/ache  **Following treatment Pt. Had muscle fatigue/soreness in lower abdomen and glutes but  not feeling hip-pain.  Patient Goals: Be able to have intercourse without pain.   OBJECTIVE  Changes in:  Abdominal:  Able to engage TA in seated, by ~ 15 deg. Of trunk extension Pt. Looses pelvic tilt.  Strength:  Pt. Able to preform kneeling squats correctly but dominant quads doing ~ 50% of work.   INTERVENTIONS THIS SESSION: Therex: Educated on and practiced tall-kneeling hip-hinges and modified boat pose to improve strength of muscles opposing tight musculature to allow reciprocal inhibition to improve balance of musculature surrounding the pelvis and improve overall posture for optimal musculature length-tension relationship and function. Self-care: discussed adjunct therapies Pt. Is practicing and reinforced the positive role they are playing. Educated on how self-touch and orgasm can help improve resting muscle tension and decrease overall spasm.  Total time: 45 min.                            PT Short Term Goals - 07/26/18 0844      PT SHORT TERM GOAL #1   Title  Patient will demonstrate improved pelvic alignment and balance of musculature surrounding the pelvis to facilitate decreased PFM spasms and decrease pelvic pain.    Baseline  LLE long, Spasms surrounding pelvis    Time  5    Period  Weeks    Status  New    Target Date  08/30/18      PT SHORT TERM  GOAL #2   Title  Patient will demonstrate a coordinated contraction, relaxation, and bulge of the pelvic floor muscles to demonstrate functional recruitment and motion and allow for improved function.    Baseline  Pt. having pain with intercourse and UUI, indicating poor coordination and spasms through PFM    Time  5    Period  Weeks    Status  New    Target Date  08/30/18      PT SHORT TERM GOAL #3   Title  Patient will demonstrate HEP x1 in the clinic to demonstrate understanding and proper form to allow for further improvement.    Baseline  Pt. lacks understanding of therapeutic  exercises that will decrease pain.    Time  5    Period  Weeks    Status  New    Target Date  08/30/18        PT Long Term Goals - 07/26/18 0848      PT LONG TERM GOAL #1   Title  Patient will report no episodes of SUI/UUI over the course of the prior two weeks to demonstrate improved functional ability.    Baseline  Pt. having mild leakage when she "runs" to the bathroom, SUI with prolonged coughing    Time  10    Period  Weeks    Status  New    Target Date  10/04/18      PT LONG TERM GOAL #2   Title  Patient will report no pain with intercourse to demonstrate improved functional ability    Baseline  Having pain with both initial penetration and deep thrusting    Time  10    Period  Weeks    Status  New    Target Date  10/04/18      PT LONG TERM GOAL #3   Title  Patient will score less than or equal to **% on the Female NIH-CPSI and ** on the VQ to demonstrate a reduction in pain, urinary symptoms, and an improved quality of life.    Baseline  To be updated at session 2    Time  10    Period  Weeks    Status  New    Target Date  10/04/18            Plan - 09/18/18 1809    Clinical Impression Statement  Pt. Responded well to all interventions today, demonstrating improved recruitment of weakened glutes and TA and decreased pain from 4/10 to 0/10 as well as understanding and correct performance of all education and exercises provided today. They will continue to benefit from skilled physical therapy to work toward remaining goals and maximize function as well as decrease likelihood of symptom increase or recurrence.     PT Next Visit Plan  ask about internal TP release and review strengthening exercises, hold-relax lengthening of hip-flexors, give 3-way wall stretches    PT Home Exercise Plan  hip-flexor stretch, Pelvic tilts in seated, LB stretch, bow-and-arrow, leg-swings, clam-shells    Consulted and Agree with Plan of Care  Patient       Patient will benefit from  skilled therapeutic intervention in order to improve the following deficits and impairments:     Visit Diagnosis: Other muscle spasm  Abnormal posture     Problem List Patient Active Problem List   Diagnosis Date Noted  . Hyperlipemia 07/24/2018   Cleophus MoltKeeli T. Gailes DPT, ATC Cleophus MoltKeeli T Gailes 09/18/2018, 6:22 PM  [place]  [facility] [place] MAIN [facility] SERVICES [address] [place], [place], [address] Phone: [phone]   Fax:  [phone]  Name: Marisa Figueroa MRN: [id number] Date of Birth: [birth year]

## 2018-09-18 NOTE — Patient Instructions (Signed)
     Inhale as you lower down gradually releasing the glutes and the lower tummy muscles. As you exhale, gradually increase the tension in the glutes and lower tummy until you reach the top and give an "extra squeeze" at the top. Repeat 10x_3_, _1-3__ Times per day.    Tuck pelvis under and hold as you lean back. Hold for ~ 15-30 seconds or until your muscles fatigue and your posture wants to falter. Repeat until you have built up to 1:30 total of hold. 1-3 times per day.

## 2018-09-25 ENCOUNTER — Ambulatory Visit: Payer: BLUE CROSS/BLUE SHIELD

## 2018-10-02 ENCOUNTER — Ambulatory Visit: Payer: BLUE CROSS/BLUE SHIELD

## 2018-10-09 ENCOUNTER — Ambulatory Visit: Payer: BLUE CROSS/BLUE SHIELD

## 2019-09-23 ENCOUNTER — Other Ambulatory Visit: Payer: Self-pay | Admitting: Internal Medicine

## 2019-09-23 DIAGNOSIS — Z1231 Encounter for screening mammogram for malignant neoplasm of breast: Secondary | ICD-10-CM

## 2019-10-06 ENCOUNTER — Ambulatory Visit
Admission: RE | Admit: 2019-10-06 | Discharge: 2019-10-06 | Disposition: A | Discharge status: Home / Self Care | Payer: Managed Care, Other (non HMO) | Source: Ambulatory Visit | Attending: Internal Medicine | Admitting: Internal Medicine

## 2019-10-06 ENCOUNTER — Other Ambulatory Visit: Payer: Self-pay

## 2019-10-06 DIAGNOSIS — Z1231 Encounter for screening mammogram for malignant neoplasm of breast: Secondary | ICD-10-CM | POA: Diagnosis not present

## 2020-11-24 ENCOUNTER — Other Ambulatory Visit: Payer: Self-pay | Admitting: Internal Medicine

## 2020-11-24 DIAGNOSIS — Z1231 Encounter for screening mammogram for malignant neoplasm of breast: Secondary | ICD-10-CM

## 2021-01-15 ENCOUNTER — Emergency Department: Payer: 59

## 2021-01-15 ENCOUNTER — Emergency Department
Admission: EM | Admit: 2021-01-15 | Discharge: 2021-01-15 | Disposition: A | Discharge status: Home / Self Care | Payer: 59 | Attending: Student in an Organized Health Care Education/Training Program | Admitting: Student in an Organized Health Care Education/Training Program

## 2021-01-15 DIAGNOSIS — S0990XA Unspecified injury of head, initial encounter: Secondary | ICD-10-CM | POA: Diagnosis not present

## 2021-01-15 DIAGNOSIS — M549 Dorsalgia, unspecified: Secondary | ICD-10-CM | POA: Insufficient documentation

## 2021-01-15 DIAGNOSIS — Y92410 Unspecified street and highway as the place of occurrence of the external cause: Secondary | ICD-10-CM | POA: Diagnosis not present

## 2021-01-15 DIAGNOSIS — S199XXA Unspecified injury of neck, initial encounter: Secondary | ICD-10-CM | POA: Diagnosis present

## 2021-01-15 DIAGNOSIS — S161XXA Strain of muscle, fascia and tendon at neck level, initial encounter: Secondary | ICD-10-CM | POA: Insufficient documentation

## 2021-01-15 HISTORY — DX: Hyperlipidemia, unspecified: E78.5
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
## 2021-01-15 NOTE — Discharge Instructions (Addendum)
-  Please return to the emergency department if you begin to experience any new or worsening symptoms. -Take Tylenol/ibuprofen as needed for pain.

## 2021-01-24 ENCOUNTER — Other Ambulatory Visit: Payer: Self-pay

## 2021-01-24 ENCOUNTER — Ambulatory Visit
Admission: RE | Admit: 2021-01-24 | Discharge: 2021-01-24 | Disposition: A | Discharge status: Home / Self Care | Payer: PRIVATE HEALTH INSURANCE | Source: Ambulatory Visit | Attending: Internal Medicine | Admitting: Internal Medicine

## 2021-01-24 DIAGNOSIS — Z1231 Encounter for screening mammogram for malignant neoplasm of breast: Secondary | ICD-10-CM | POA: Insufficient documentation

## 2022-02-08 ENCOUNTER — Other Ambulatory Visit: Payer: Self-pay | Admitting: Internal Medicine

## 2022-02-08 DIAGNOSIS — Z1231 Encounter for screening mammogram for malignant neoplasm of breast: Secondary | ICD-10-CM

## 2022-02-14 ENCOUNTER — Ambulatory Visit
Admission: RE | Admit: 2022-02-14 | Discharge: 2022-02-14 | Disposition: A | Discharge status: Home / Self Care | Payer: BLUE CROSS/BLUE SHIELD | Source: Ambulatory Visit | Attending: Internal Medicine | Admitting: Internal Medicine

## 2022-02-14 DIAGNOSIS — Z1231 Encounter for screening mammogram for malignant neoplasm of breast: Secondary | ICD-10-CM | POA: Diagnosis present

## 2022-08-05 ENCOUNTER — Other Ambulatory Visit: Payer: Self-pay

## 2022-08-05 ENCOUNTER — Emergency Department
Admission: EM | Admit: 2022-08-05 | Discharge: 2022-08-05 | Disposition: A | Discharge status: Home / Self Care | Payer: BLUE CROSS/BLUE SHIELD | Attending: Emergency Medicine | Admitting: Emergency Medicine

## 2022-08-05 ENCOUNTER — Emergency Department: Payer: BLUE CROSS/BLUE SHIELD

## 2022-08-05 DIAGNOSIS — N132 Hydronephrosis with renal and ureteral calculous obstruction: Secondary | ICD-10-CM | POA: Insufficient documentation

## 2022-08-05 DIAGNOSIS — R109 Unspecified abdominal pain: Secondary | ICD-10-CM | POA: Diagnosis present

## 2022-08-05 DIAGNOSIS — N200 Calculus of kidney: Secondary | ICD-10-CM

## 2022-08-05 LAB — URINALYSIS, ROUTINE W REFLEX MICROSCOPIC
Bilirubin Urine: NEGATIVE
Glucose, UA: NEGATIVE mg/dL
Ketones, ur: NEGATIVE mg/dL
Nitrite: NEGATIVE
Protein, ur: 30 mg/dL — AB
RBC / HPF: >50 RBC/hpf (ref 0–5)
Specific Gravity, Urine: 1.018 (ref 1.005–1.030)
pH: 5 (ref 5.0–8.0)

## 2022-08-05 LAB — COMPREHENSIVE METABOLIC PANEL
ALT: 15 U/L (ref 0–44)
AST: 21 U/L (ref 15–41)
Albumin: 4.6 g/dL (ref 3.5–5.0)
Alkaline Phosphatase: 61 U/L (ref 38–126)
Anion gap: 11 (ref 5–15)
BUN: 14 mg/dL (ref 6–20)
CO2: 21 mmol/L — ABNORMAL LOW (ref 22–32)
Calcium: 9.1 mg/dL (ref 8.9–10.3)
Chloride: 104 mmol/L (ref 98–111)
Creatinine, Ser: 0.75 mg/dL (ref 0.44–1.00)
GFR, Estimated: >60 mL/min (ref >60)
Glucose, Bld: 108 mg/dL — ABNORMAL HIGH (ref 70–99)
Potassium: 3.4 mmol/L — ABNORMAL LOW (ref 3.5–5.1)
Sodium: 136 mmol/L (ref 135–145)
Total Bilirubin: 0.6 mg/dL (ref 0.3–1.2)
Total Protein: 7.5 g/dL (ref 6.5–8.1)

## 2022-08-05 LAB — LIPASE, BLOOD: Lipase: 31 U/L (ref 11–51)

## 2022-08-05 LAB — CBC
HCT: 44.3 % (ref 36.0–46.0)
Hemoglobin: 14.7 g/dL (ref 12.0–15.0)
MCH: 28.9 pg (ref 26.0–34.0)
MCHC: 33.2 g/dL (ref 30.0–36.0)
MCV: 87 fL (ref 80.0–100.0)
Platelets: 233 10*3/uL (ref 150–400)
RBC: 5.09 MIL/uL (ref 3.87–5.11)
RDW: 13.3 % (ref 11.5–15.5)
WBC: 9.7 10*3/uL (ref 4.0–10.5)
nRBC: 0 % (ref 0.0–0.2)

## 2022-08-05 MED ORDER — KETOROLAC TROMETHAMINE 10 MG PO TABS: 10.0000 mg | ORAL_TABLET | Freq: Three times a day (TID) | ORAL | 0 refills | Status: DC | PRN

## 2022-08-05 MED ORDER — ONDANSETRON HCL 4 MG/2ML IJ SOLN
4.0000 mg | Freq: Once | INTRAMUSCULAR | Status: AC
  Administered 2022-08-05: 4 mg via INTRAVENOUS
  Filled 2022-08-05: qty 2

## 2022-08-05 MED ORDER — SODIUM CHLORIDE 0.9 % IV BOLUS
1000.0000 mL | Freq: Once | INTRAVENOUS | Status: AC
  Administered 2022-08-05: 1000 mL via INTRAVENOUS

## 2022-08-05 MED ORDER — ONDANSETRON HCL 4 MG PO TABS: 4.0000 mg | ORAL_TABLET | Freq: Three times a day (TID) | ORAL | 0 refills | Status: DC | PRN

## 2022-08-05 MED ORDER — OXYCODONE-ACETAMINOPHEN 5-325 MG PO TABS
1.0000 | ORAL_TABLET | Freq: Four times a day (QID) | ORAL | 0 refills | Status: DC | PRN
Start: 2022-08-05 — End: 2023-06-18

## 2022-08-05 MED ORDER — KETOROLAC TROMETHAMINE 15 MG/ML IJ SOLN
15.0000 mg | Freq: Once | INTRAMUSCULAR | Status: AC
  Administered 2022-08-05: 15 mg via INTRAVENOUS
  Filled 2022-08-05: qty 1

## 2022-08-05 MED ORDER — OXYCODONE-ACETAMINOPHEN 5-325 MG PO TABS
1.0000 | ORAL_TABLET | Freq: Once | ORAL | Status: AC
  Administered 2022-08-05: 1 via ORAL
  Filled 2022-08-05: qty 1

## 2022-08-05 MED ORDER — TAMSULOSIN HCL 0.4 MG PO CAPS: 0.4000 mg | ORAL_CAPSULE | Freq: Every day | ORAL | 0 refills | Status: DC

## 2022-08-05 MED ORDER — IOHEXOL 300 MG/ML  SOLN
100.0000 mL | Freq: Once | INTRAMUSCULAR | Status: AC | PRN
  Administered 2022-08-05: 100 mL via INTRAVENOUS

## 2022-08-05 MED ORDER — ONDANSETRON 4 MG PO TBDP
4.0000 mg | ORAL_TABLET | Freq: Once | ORAL | Status: AC
  Administered 2022-08-05: 4 mg via ORAL
  Filled 2022-08-05: qty 1

## 2022-08-05 MED ORDER — TAMSULOSIN HCL 0.4 MG PO CAPS
0.4000 mg | ORAL_CAPSULE | Freq: Once | ORAL | Status: AC
  Administered 2022-08-05: 0.4 mg via ORAL
  Filled 2022-08-05: qty 1

## 2022-08-05 MED ORDER — MORPHINE SULFATE (PF) 4 MG/ML IV SOLN
4.0000 mg | Freq: Once | INTRAVENOUS | Status: AC
  Administered 2022-08-05: 4 mg via INTRAVENOUS
  Filled 2022-08-05: qty 1

## 2022-08-05 NOTE — ED Provider Notes (Signed)
[facility] Provider Note    Event Date/Time   First MD Initiated Contact with Patient 08/05/22 1930     (approximate)   History   Abdominal Pain   HPI  Marisa Figueroa is a 52 y.o. female who presents to the emergency department today because of concerns for abdominal pain.  Patient pain started a couple of days ago.  Initially it was around her bellybutton.  It is now more right-sided.  She did notice some difficulty with urination although denies any bad odor or blood in her urine.  Patient has had decreased appetite.  She denies any fevers.  States many members of her family have had appendicitis.  Her mother had a cholecystectomy.  The patient denies similar pain in the past.     Physical Exam   Triage Vital Signs: ED Triage Vitals  Encounter Vitals Group     BP 08/05/22 1905 (!) 146/103     Systolic BP Percentile --      Diastolic BP Percentile --      Pulse Rate 08/05/22 1905 83     Resp 08/05/22 1905 16     Temp 08/05/22 1905 97.7 F (36.5 C)     Temp Source 08/05/22 1905 Oral     SpO2 08/05/22 1905 100 %     Weight --      Height 08/05/22 1906 5' (1.524 m)     Head Circumference --      Peak Flow --      Pain Score 08/05/22 1906 10     Pain Loc --      Pain Education --      Exclude from Growth Chart --     Most recent vital signs: Vitals:   08/05/22 1905  BP: (!) 146/103  Pulse: 83  Resp: 16  Temp: 97.7 F (36.5 C)  SpO2: 100%   General: Awake, alert, oriented. CV:  Good peripheral perfusion. Regular rate and rhythm. Resp:  Normal effort. Lungs clear. Abd:  No distention. Tender to palpation on the right side. Positive CVA tenderness.   ED Results / Procedures / Treatments   Labs (all labs ordered are listed, but only abnormal results are displayed) Labs Reviewed  COMPREHENSIVE METABOLIC PANEL - Abnormal; Notable for the following components:      Result Value   Potassium 3.4 (*)    CO2 21 (*)    Glucose, Bld 108  (*)    All other components within normal limits  URINALYSIS, ROUTINE W REFLEX MICROSCOPIC - Abnormal; Notable for the following components:   Color, Urine YELLOW (*)    APPearance HAZY (*)    Hgb urine dipstick MODERATE (*)    Protein, ur 30 (*)    Leukocytes,Ua SMALL (*)    Bacteria, UA FEW (*)    All other components within normal limits  LIPASE, BLOOD  CBC     EKG  None   RADIOLOGY I independently interpreted and visualized the CT abd/pel. My interpretation: No free air Radiology interpretation:  IMPRESSION:  1. Punctate less than 2 mm obstructing distal right ureteral  calculus, with mild right-sided hydronephrosis and hydroureter.  2. 5 mm nonobstructing left renal calculus.  3.  Aortic Atherosclerosis (ICD10-I70.0).      PROCEDURES:  Critical Care performed: No   MEDICATIONS ORDERED IN ED: Medications  ondansetron (ZOFRAN-ODT) disintegrating tablet 4 mg (4 mg Oral Given 08/05/22 1930)     IMPRESSION / MDM / ASSESSMENT AND PLAN / ED  COURSE  I reviewed the triage vital signs and the nursing notes.                              Differential diagnosis includes, but is not limited to, kidney stone, gallbladder disease, appendicitis  Patient's presentation is most consistent with acute presentation with potential threat to life or bodily function.   The patient is on the cardiac monitor to evaluate for evidence of arrhythmia and/or significant heart rate changes.  Patient presented to the emergency department today because of concerns for right sided abdominal pain.  On exam she is tender to that area with slight CVA tenderness.  Given concern for appendicitis versus kidney stone versus less likely gallbladder disease will obtain CT scan.  Will give IV fluids and pain medication.  CT scan is consistent with kidney stone.  I discussed this with the patient.  She will did feel better after medication.  Will plan on discharging with medication to help with the  pain as well as Flomax.  Discussed with patient follow-up and return precautions.      FINAL CLINICAL IMPRESSION(S) / ED DIAGNOSES   Final diagnoses:  Kidney stone      Note:  This document was prepared using Dragon voice recognition software and may include unintentional dictation errors.    Phineas Semen, MD 08/06/22 [phone]
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
## 2022-08-05 NOTE — Discharge Instructions (Addendum)
Please seek medical attention for any high fevers, chest pain, shortness of breath, change in behavior, persistent vomiting, bloody stool or any other new or concerning symptoms.  

## 2022-08-08 ENCOUNTER — Ambulatory Visit: Payer: BLUE CROSS/BLUE SHIELD | Admitting: Urology

## 2022-08-08 ENCOUNTER — Encounter: Payer: Self-pay | Admitting: Urology

## 2022-08-08 VITALS — BP 114/74 | HR 85

## 2022-08-08 DIAGNOSIS — R1030 Lower abdominal pain, unspecified: Secondary | ICD-10-CM | POA: Diagnosis not present

## 2022-08-08 DIAGNOSIS — N201 Calculus of ureter: Secondary | ICD-10-CM | POA: Diagnosis not present

## 2022-08-08 DIAGNOSIS — N200 Calculus of kidney: Secondary | ICD-10-CM

## 2022-08-08 LAB — URINALYSIS, COMPLETE
Bilirubin, UA: NEGATIVE
Glucose, UA: NEGATIVE
Nitrite, UA: NEGATIVE
Specific Gravity, UA: 1.02 (ref 1.005–1.030)
Urobilinogen, Ur: 0.2 mg/dL (ref 0.2–1.0)
pH, UA: 6 (ref 5.0–7.5)

## 2022-08-08 LAB — MICROSCOPIC EXAMINATION
Epithelial Cells (non renal): >10 /hpf — AB (ref 0–10)
WBC, UA: >30 /hpf — AB (ref 0–5)

## 2022-08-08 MED ORDER — CEPHALEXIN 500 MG PO CAPS: 500.0000 mg | ORAL_CAPSULE | Freq: Three times a day (TID) | ORAL | 0 refills | Status: DC

## 2022-08-08 MED ORDER — CEPHALEXIN 500 MG PO CAPS
500.0000 mg | ORAL_CAPSULE | Freq: Three times a day (TID) | ORAL | 0 refills | Status: AC
Start: 2022-08-08 — End: 2022-08-15

## 2022-08-08 NOTE — Progress Notes (Signed)
I,Amy L Pierron,acting as a scribe for Vanna Scotland, MD.,have documented all relevant documentation on the behalf of Vanna Scotland, MD,as directed by  Vanna Scotland, MD while in the presence of Vanna Scotland, MD.  08/08/2022 7:34 PM   Marisa Figueroa [birth year] [id number]  Referring provider: Danella Penton, MD [phone] [facility] [address] [facility] [place],  [place] [address]  Chief Complaint  Patient presents with   Follow-up   Nephrolithiasis    HPI: 52 year-old female presents today for a follow-up of a recent stone event.  She was seen and evaluated 3 days ago in the ER for paraumbilical pain on the right side. Her labs showed somewhat suspicious urine with blood. She had a CT scan with contrast which showed punctate 2mm right distal ureteral stone with some mild right hydrourinephrosis and a non-obstructing left-sided stone. She had no concern for infection. She was discharged with Flomax and advised to follow up with urology.   She has no personal history of kidney stones.  Her UA today has greater than 30 white blood cells per high powered field, 3 to 10 red blood cells; many bacteria although her urinalysis is grossly contaminated with greater than 10 epithelial cells.  She is not sure if she has passed a stone yet. Her urine is still dark even though she is only drinking water. Her pain is not bad right now. She feels exhausted, feverish, and has chills. However she has not taken her temperature.   She also reports feeling pain in her bladder so she started drinking cranberry juice.   She is hoping to see a GI doctor soon as well due to stomach pain, bloating, and gas.  Results for orders placed or performed in visit on 08/08/22  Microscopic Examination   Urine  Result Value Ref Range   WBC, UA >30 (A) 0 - 5 /hpf   RBC, Urine 3-10 (A) 0 - 2 /hpf   Epithelial Cells (non renal) >10 (A) 0 - 10 /hpf   Mucus, UA Present (A) Not Estab.   Bacteria, UA  Many (A) None seen/Few  Urinalysis, Complete  Result Value Ref Range   Specific Gravity, UA 1.020 1.005 - 1.030   pH, UA 6.0 5.0 - 7.5   Color, UA Yellow Yellow   Appearance Ur Hazy (A) Clear   Leukocytes,UA 1+ (A) Negative   Protein,UA 2+ (A) Negative/Trace   Glucose, UA Negative Negative   Ketones, UA 2+ (A) Negative   RBC, UA 2+ (A) Negative   Bilirubin, UA Negative Negative   Urobilinogen, Ur 0.2 0.2 - 1.0 mg/dL   Nitrite, UA Negative Negative   Microscopic Examination See below:     PMH: Past Medical History:  Diagnosis Date   Hyperlipidemia     Surgical History: Past Surgical History:  Procedure Laterality Date   WISDOM TOOTH EXTRACTION      Home Medications:  Allergies as of 08/08/2022       Reactions   Lactose Intolerance (gi)    Penicillins Itching   Epinephrine Palpitations        Medication List        Accurate as of August 08, 2022  7:34 PM. If you have any questions, ask your nurse or doctor.          STOP taking these medications    conjugated estrogens 0.625 MG/GM vaginal cream Commonly known as: PREMARIN Stopped by: Vanna Scotland   estradiol 0.1 MG/GM vaginal cream Commonly known as: ESTRACE  Stopped by: Vanna Scotland   progesterone 100 MG vaginal insert Commonly known as: ENDOMETRIN Stopped by: Vanna Scotland       TAKE these medications    cephALEXin 500 MG capsule Commonly known as: Keflex Take 1 capsule (500 mg total) by mouth 3 (three) times daily for 7 days. Started by: Vanna Scotland   ketoconazole 2 % shampoo Commonly known as: NIZORAL Apply 1 application topically 2 (two) times a week.   ketorolac 10 MG tablet Commonly known as: TORADOL Take 1 tablet (10 mg total) by mouth every 8 (eight) hours as needed for severe pain.   ondansetron 4 MG tablet Commonly known as: Zofran Take 1 tablet (4 mg total) by mouth every 8 (eight) hours as needed.   oxyCODONE-acetaminophen 5-325 MG tablet Commonly known as:  Percocet Take 1 tablet by mouth every 6 (six) hours as needed for severe pain.   rosuvastatin 5 MG tablet Commonly known as: CRESTOR Take 5 mg by mouth daily.   tamsulosin 0.4 MG Caps capsule Commonly known as: Flomax Take 1 capsule (0.4 mg total) by mouth daily.        Allergies:  Allergies  Allergen Reactions   Lactose Intolerance (Gi)    Penicillins Itching   Epinephrine Palpitations    Family History: Family History  Problem Relation Age of Onset   Breast cancer Paternal Aunt     Social History:  reports that she has been smoking cigarettes. She has a 2.8 pack-year smoking history. She has never used smokeless tobacco. She reports current alcohol use. No history on file for drug use.   Physical Exam: BP 114/74   Pulse 85   Constitutional:  Alert and oriented, No acute distress. HEENT: [place] AT, moist mucus membranes.  Trachea midline, no masses. Neurologic: Grossly intact, no focal deficits, moving all 4 extremities. Psychiatric: Normal mood and affect.  Urinalysis    Component Value Date/Time   COLORURINE YELLOW (A) 08/05/2022 1907   APPEARANCEUR Hazy (A) 08/08/2022 1406   LABSPEC 1.018 08/05/2022 1907   PHURINE 5.0 08/05/2022 1907   GLUCOSEU Negative 08/08/2022 1406   HGBUR MODERATE (A) 08/05/2022 1907   BILIRUBINUR Negative 08/08/2022 1406   KETONESUR NEGATIVE 08/05/2022 1907   PROTEINUR 2+ (A) 08/08/2022 1406   PROTEINUR 30 (A) 08/05/2022 1907   NITRITE Negative 08/08/2022 1406   NITRITE NEGATIVE 08/05/2022 1907   LEUKOCYTESUR 1+ (A) 08/08/2022 1406   LEUKOCYTESUR SMALL (A) 08/05/2022 1907    Lab Results  Component Value Date   LABMICR See below: 08/08/2022   WBCUA >30 (A) 08/08/2022   LABEPIT >10 (A) 08/08/2022   MUCUS Present (A) 08/08/2022   BACTERIA Many (A) 08/08/2022    Pertinent Imaging: CLINICAL DATA:  Abdominal pain, loss of appetite, nausea   EXAM: CT ABDOMEN AND PELVIS WITH CONTRAST   TECHNIQUE: Multidetector CT imaging of the  abdomen and pelvis was performed using the standard protocol following bolus administration of intravenous contrast.   RADIATION DOSE REDUCTION: This exam was performed according to the departmental dose-optimization program which includes automated exposure control, adjustment of the mA and/or kV according to patient size and/or use of iterative reconstruction technique.   CONTRAST:  OMNIPAQUE IOHEXOL 300 MG/ML  SOLN   COMPARISON:  None Available.   FINDINGS: Lower chest: No acute pleural or parenchymal lung disease.   Hepatobiliary: No focal liver abnormality is seen. No gallstones, gallbladder wall thickening, or biliary dilatation.   Pancreas: Unremarkable. No pancreatic ductal dilatation or surrounding inflammatory changes.  Spleen: Normal in size without focal abnormality.   Adrenals/Urinary Tract: There is a punctate less than 2 mm obstructing distal right ureteral calculus reference image 73/2. Mild right-sided hydronephrosis and hydroureter.   5 mm nonobstructing left renal calculus. The adrenals and bladder are unremarkable.   Stomach/Bowel: No bowel obstruction or ileus. Normal appendix right mid abdomen. No bowel wall thickening or inflammatory change.   Vascular/Lymphatic: Aortic atherosclerosis. No enlarged abdominal or pelvic lymph nodes.   Reproductive: Uterus and bilateral adnexa are unremarkable.   Other: No free fluid or free intraperitoneal gas. No abdominal wall hernia.   Musculoskeletal: No acute or destructive bony abnormalities. Reconstructed images demonstrate no additional findings.   IMPRESSION: 1. Punctate less than 2 mm obstructing distal right ureteral calculus, with mild right-sided hydronephrosis and hydroureter. 2. 5 mm nonobstructing left renal calculus. 3.  Aortic Atherosclerosis (ICD10-I70.0).    Electronically Signed   By: Sharlet Salina M.D.   On: 08/05/2022 21:27 Personally reviewed images and agree with radiologic  interpretation.   Assessment & Plan:    Right distal ureteral stone  - Will likely pass on her own in the near future (very small and distal).  Alternatives including ESWL versus ureteroscopy was also briefly discussed but would favor conservative management to which she is agreeable. -. Continue taking Flomax. Will send urine for a culture.  Gave a strainer to try to collect the stone.  - Emphasized the importance of taking her temperature at home and if it is higher than 101 degrees she needs to go back the ER.  - We discussed general stone prevention techniques including drinking plenty water with goal of producing 2.5 L urine daily, increased citric acid intake, avoidance of high oxalate containing foods, and decreased salt intake.  Information about dietary recommendations given today.   - Return in 2 weeks with a KUB. If she ends up passing it at home she will call to cancel.  2. Possible acute cystitis -rescribed Keflex 500 mg t.I.d for 7 days. -- Emphasized the importance of taking her temperature at home and if it is higher than 101 degrees she needs to go back the ER; very stone precautions -UCx sent today  3. Nonobstructing left ureteral stone Would recommend conservative management with.  We did discuss the option of prophylactic ESWL versus ureteroscopy down the road.  Return in about 1 year (around 08/08/2023) for KUB.  I have reviewed the above documentation for accuracy and completeness, and I agree with the above.   Vanna Scotland, MD    [facility] [address], [address] [place], [place] [address] [phone]

## 2022-08-22 ENCOUNTER — Ambulatory Visit: Payer: BLUE CROSS/BLUE SHIELD | Admitting: Urology

## 2022-08-28 ENCOUNTER — Other Ambulatory Visit: Payer: Self-pay | Admitting: Urology

## 2022-08-28 DIAGNOSIS — N200 Calculus of kidney: Secondary | ICD-10-CM

## 2022-08-28 DIAGNOSIS — R3129 Other microscopic hematuria: Secondary | ICD-10-CM

## 2022-08-28 NOTE — Progress Notes (Signed)
08/29/2022 11:35 AM   Marisa Figueroa 1970/08/18 [id number]  Referring provider: Danella Penton, MD [phone] [facility] [address] [facility] [place],  [place] [address]  Urological history: 1. Nephrolithiasis -contrast CT (07/2022) - Punctate less than 2 mm obstructing distal right ureteral calculus, with mild right-sided hydronephrosis and hydroureter.  5 mm nonobstructing left renal calculus.  Chief Complaint  Patient presents with   Follow-up   HPI: Marisa Figueroa is a 52 y.o. female who presents today for stones.   Previous records reviewed.   She was seen by Dr. Apolinar Junes on 08/08/2022 after being seen in the ED and diagnosed with a 2 mm right distal ureteral stone.    She has since passed the stone and she brings it in today for analysis.  Patient denies any modifying or aggravating factors.  Patient denies any gross hematuria, dysuria or suprapubic/flank pain.  Patient denies any fevers, chills, nausea or vomiting.   UA yellow clear, specific gravity 1.010, pH 7.0, 1+ leukocytes, 11-30 WBCs, 0-2 RBCs, 0-10 epithelial cells and moderate bacteria.  KUB 2 mm right distal stone not visible on today's x-ray.  PMH: Past Medical History:  Diagnosis Date   Hyperlipidemia     Surgical History: Past Surgical History:  Procedure Laterality Date   WISDOM TOOTH EXTRACTION      Home Medications:  Allergies as of 08/29/2022       Reactions   Lactose Other (See Comments)   Gi upset   Lactose Intolerance (gi)    Penicillins Itching   Simvastatin Other (See Comments)   Other reaction(s): Unknown   Epinephrine Palpitations        Medication List        Accurate as of August 29, 2022 11:35 AM. If you have any questions, ask your nurse or doctor.          ketoconazole 2 % shampoo Commonly known as: NIZORAL Apply 1 application topically 2 (two) times a week.   ketorolac 10 MG tablet Commonly known as: TORADOL Take 1 tablet (10 mg total) by mouth  every 8 (eight) hours as needed for severe pain.   ondansetron 4 MG tablet Commonly known as: Zofran Take 1 tablet (4 mg total) by mouth every 8 (eight) hours as needed.   oxyCODONE-acetaminophen 5-325 MG tablet Commonly known as: Percocet Take 1 tablet by mouth every 6 (six) hours as needed for severe pain.   rosuvastatin 5 MG tablet Commonly known as: CRESTOR Take 5 mg by mouth daily.   tamsulosin 0.4 MG Caps capsule Commonly known as: Flomax Take 1 capsule (0.4 mg total) by mouth daily.        Allergies:  Allergies  Allergen Reactions   Lactose Other (See Comments)    Gi upset   Lactose Intolerance (Gi)    Penicillins Itching   Simvastatin Other (See Comments)    Other reaction(s): Unknown   Epinephrine Palpitations    Family History: Family History  Problem Relation Age of Onset   Breast cancer Paternal Aunt     Social History:  reports that she has been smoking cigarettes. She has a 2.8 pack-year smoking history. She has never used smokeless tobacco. She reports current alcohol use. No history on file for drug use.  ROS: Pertinent ROS in HPI  Physical Exam: BP (!) 140/86   Pulse (!) 59   Ht 5' (1.524 m)   Wt 160 lb (72.6 kg)   BMI 31.25 kg/m   Constitutional:  Well nourished.  Alert and oriented, No acute distress. HEENT: [place] AT, moist mucus membranes.  Trachea midline, no masses. Cardiovascular: No clubbing, cyanosis, or edema. Respiratory: Normal respiratory effort, no increased work of breathing. Neurologic: Grossly intact, no focal deficits, moving all 4 extremities. Psychiatric: Normal mood and affect.  Laboratory Data: Lab Results  Component Value Date   WBC 9.7 08/05/2022   HGB 14.7 08/05/2022   HCT 44.3 08/05/2022   MCV 87.0 08/05/2022   PLT 233 08/05/2022    Lab Results  Component Value Date   CREATININE 0.75 08/05/2022    Lab Results  Component Value Date   AST 21 08/05/2022   Lab Results  Component Value Date   ALT 15  08/05/2022    Urinalysis Component     Latest Ref Rng 08/29/2022  Glucose, UA     Negative  Negative   Specific Gravity, UA     1.005 - 1.030  1.010   pH, UA     5.0 - 7.5  7.0   Color, UA     Yellow  Yellow   Appearance Ur     Clear  Clear   Leukocytes,UA     Negative  1+ !   Protein,UA     Negative/Trace  Negative   Ketones, UA     Negative  Negative   RBC, UA     Negative  Negative   Bilirubin, UA     Negative  Negative   Urobilinogen, Ur     0.2 - 1.0 mg/dL 0.2   Nitrite, UA     Negative  Negative   Microscopic Examination See below:     Legend: ! Abnormal  Component     Latest Ref Rng 08/29/2022  WBC, UA     0 - 5 /hpf 11-30 !   Bacteria, UA     None seen/Few  Moderate !   RBC, Urine     0 - 2 /hpf 0-2   Epithelial Cells (non renal)     0 - 10 /hpf 0-10     Legend: ! Abnormal I have reviewed the labs.   Pertinent Imaging: CLINICAL DATA:  Abdominal pain, loss of appetite, nausea   EXAM: CT ABDOMEN AND PELVIS WITH CONTRAST   TECHNIQUE: Multidetector CT imaging of the abdomen and pelvis was performed using the standard protocol following bolus administration of intravenous contrast.   RADIATION DOSE REDUCTION: This exam was performed according to the departmental dose-optimization program which includes automated exposure control, adjustment of the mA and/or kV according to patient size and/or use of iterative reconstruction technique.   CONTRAST:  OMNIPAQUE IOHEXOL 300 MG/ML  SOLN   COMPARISON:  None Available.   FINDINGS: Lower chest: No acute pleural or parenchymal lung disease.   Hepatobiliary: No focal liver abnormality is seen. No gallstones, gallbladder wall thickening, or biliary dilatation.   Pancreas: Unremarkable. No pancreatic ductal dilatation or surrounding inflammatory changes.   Spleen: Normal in size without focal abnormality.   Adrenals/Urinary Tract: There is a punctate less than 2 mm obstructing distal right  ureteral calculus reference image 73/2. Mild right-sided hydronephrosis and hydroureter.   5 mm nonobstructing left renal calculus. The adrenals and bladder are unremarkable.   Stomach/Bowel: No bowel obstruction or ileus. Normal appendix right mid abdomen. No bowel wall thickening or inflammatory change.   Vascular/Lymphatic: Aortic atherosclerosis. No enlarged abdominal or pelvic lymph nodes.   Reproductive: Uterus and bilateral adnexa are unremarkable.   Other: No free fluid or free intraperitoneal  gas. No abdominal wall hernia.   Musculoskeletal: No acute or destructive bony abnormalities. Reconstructed images demonstrate no additional findings.   IMPRESSION: 1. Punctate less than 2 mm obstructing distal right ureteral calculus, with mild right-sided hydronephrosis and hydroureter. 2. 5 mm nonobstructing left renal calculus. 3.  Aortic Atherosclerosis (ICD10-I70.0).     Electronically Signed   By: Sharlet Salina M.D.   On: 08/05/2022 21:27  CLINICAL DATA:  Flank pain.  Renal stones.   EXAM: ABDOMEN - 1 VIEW   COMPARISON:  07/26/2022.   FINDINGS: The bowel gas pattern is normal. Stones overlie the left kidney measuring 7 mm and 4 mm. Right kidneys obscured by overlying stool shadows.   IMPRESSION: Left-sided nephrolithiasis.  Evaluation is limited.     Electronically Signed   By: Layla Maw M.D.   On: 09/03/2022 00:44 I have independently reviewed the films.  See HPI.   Assessment & Plan:    1. Right ureteral stone -UA unremarkable  -KUB stone not seen  -Stone fragment will be sent for analysis, I will contact her with the results   2. Left renal stone -KUB stable left renal stone   3. Pyuria -Explained that since she is asymptomatic, this is not likely from a UTI, but I will go ahead and send it for culture in case she should develop symptoms after this visit of a urinary tract infection  Return for keep follow up appointment .  These notes  generated with voice recognition software. I apologize for typographical errors.  Cloretta Ned  [facility] [address]  [address] [place], [place] [address] [phone]

## 2022-08-29 ENCOUNTER — Ambulatory Visit: Payer: BLUE CROSS/BLUE SHIELD | Admitting: Urology

## 2022-08-29 ENCOUNTER — Ambulatory Visit
Admission: RE | Admit: 2022-08-29 | Discharge: 2022-08-29 | Disposition: A | Discharge status: Home / Self Care | Payer: BLUE CROSS/BLUE SHIELD | Attending: Urology | Admitting: Urology

## 2022-08-29 ENCOUNTER — Ambulatory Visit
Admission: RE | Admit: 2022-08-29 | Discharge: 2022-08-29 | Disposition: A | Discharge status: Home / Self Care | Payer: BLUE CROSS/BLUE SHIELD | Source: Ambulatory Visit | Attending: Urology | Admitting: Urology

## 2022-08-29 ENCOUNTER — Encounter: Payer: Self-pay | Admitting: Urology

## 2022-08-29 VITALS — BP 140/86 | HR 59 | Ht 60.0 in | Wt 160.0 lb

## 2022-08-29 DIAGNOSIS — N200 Calculus of kidney: Secondary | ICD-10-CM | POA: Insufficient documentation

## 2022-08-29 DIAGNOSIS — R300 Dysuria: Secondary | ICD-10-CM | POA: Diagnosis not present

## 2022-08-29 DIAGNOSIS — N201 Calculus of ureter: Secondary | ICD-10-CM

## 2022-08-29 LAB — URINALYSIS, COMPLETE
Bilirubin, UA: NEGATIVE
Glucose, UA: NEGATIVE
Ketones, UA: NEGATIVE
Nitrite, UA: NEGATIVE
Protein,UA: NEGATIVE
RBC, UA: NEGATIVE
Specific Gravity, UA: 1.01 (ref 1.005–1.030)
Urobilinogen, Ur: 0.2 mg/dL (ref 0.2–1.0)
pH, UA: 7 (ref 5.0–7.5)

## 2022-08-29 LAB — MICROSCOPIC EXAMINATION

## 2022-08-29 NOTE — Patient Instructions (Signed)
"  Penalty" List  Bathroom Penalty: An extra 4-ounce glass of water with every visit to the bathroom, regardless of the reason. Chair Penalty: Have a bottle of water and a 4-ounce glass next to their favorite chairs at home. Instruct the patient to drink one 4-ounce glass before getting up from the chair. Cheating Penalty: If they eat or drink a restricted item beyond the allowable limit, one extra glass of water. Kitchen Penalty: One glass of water whenever they walk into their kitchen. Leaving Home Penalty. One extra glass of water prior to leaving home and another when they return. Meal Penalty: One extra glass with each meal except when they eat out, where they will need two extra glasses. Medication Penalty: One extra glass of water when taking medications. This is in addition to whatever patients are instructed to take normally with their medications. Nighttime Bathroom Use Penalty: One glass of water whenever they get out of bed to go to the bathroom at night. Snack Penalty: One extra glass of water if they have a snack between meals or bedtime. Summertime Penalty: Double all other penalties during the summer or when outside temperatures exceed 85 degrees. Time Penalty: One glass of water if the patient has managed to avoid all other "penalties" for 2 hours during the daytime. Water Fountain Penalty: They must drink at least 5 swallows whenever they pass a water fountain. Work Penalty: One glass of water whenever they leave their main desk or workplace. Patients should always have water and a 4-ounce glass available on their desks, in their cars, or within easy reach.  

## 2022-09-01 LAB — CULTURE, URINE COMPREHENSIVE

## 2022-09-03 ENCOUNTER — Telehealth: Payer: Self-pay | Admitting: Urology

## 2022-09-03 NOTE — Telephone Encounter (Signed)
I sent her a MyCHART message, but she hasn't seen it.  Would you call her?     Marisa Figueroa, Your urine culture has returned positive for bacteria.  As we discussed during the office visit, if you are starting to have symptoms of a urinary tract infection, we should probably start you on antibiotic.  Please let me know if you are. Marisa Kesling, PA-C

## 2022-09-04 NOTE — Telephone Encounter (Signed)
Left detailed message on VM advised pt to return call if any problems

## 2022-09-06 NOTE — Telephone Encounter (Signed)
Patient dropped in office today and she is having uti symptoms (frequency, urgency).  She would like to be started on antibiotic. Pharmacy is [facility].

## 2022-09-07 ENCOUNTER — Telehealth: Payer: Self-pay | Admitting: *Deleted

## 2022-09-07 LAB — CALCULI, WITH PHOTOGRAPH (CLINICAL LAB)
Calcium Oxalate Dihydrate: 100 %
Size Calculi: <1 mm
Weight Calculi: <1 mg

## 2022-09-07 MED ORDER — SULFAMETHOXAZOLE-TRIMETHOPRIM 800-160 MG PO TABS: 1.0000 | ORAL_TABLET | Freq: Two times a day (BID) | ORAL | 0 refills | Status: AC

## 2022-09-07 NOTE — Telephone Encounter (Signed)
-----   Message from [facility] sent at 09/07/2022  1:17 PM EDT ----- Please send in a script for Septra DS twice daily for seven days and let her know that her stone analysis showed she made a calcium oxalate stone.  I will send her stone prevention tips through her MyChart.

## 2022-09-07 NOTE — Telephone Encounter (Signed)
.  left message to have patient return my call. Rx sent to [facility]

## 2022-10-04 DIAGNOSIS — N200 Calculus of kidney: Secondary | ICD-10-CM | POA: Insufficient documentation

## 2022-12-22 IMAGING — MG MM DIGITAL SCREENING BILAT W/ TOMO AND CAD
8 series · 8 of 24 positions shown · non-contrast
Comparison: Previous exam(s).

CLINICAL DATA: Screening.

EXAM:
DIGITAL SCREENING BILATERAL MAMMOGRAM WITH TOMOSYNTHESIS AND CAD
TECHNIQUE: Bilateral screening digital craniocaudal and mediolateral oblique
mammograms were obtained. Bilateral screening digital breast
tomosynthesis was performed. The images were evaluated with
computer-aided detection.

[R CC synth-2D]
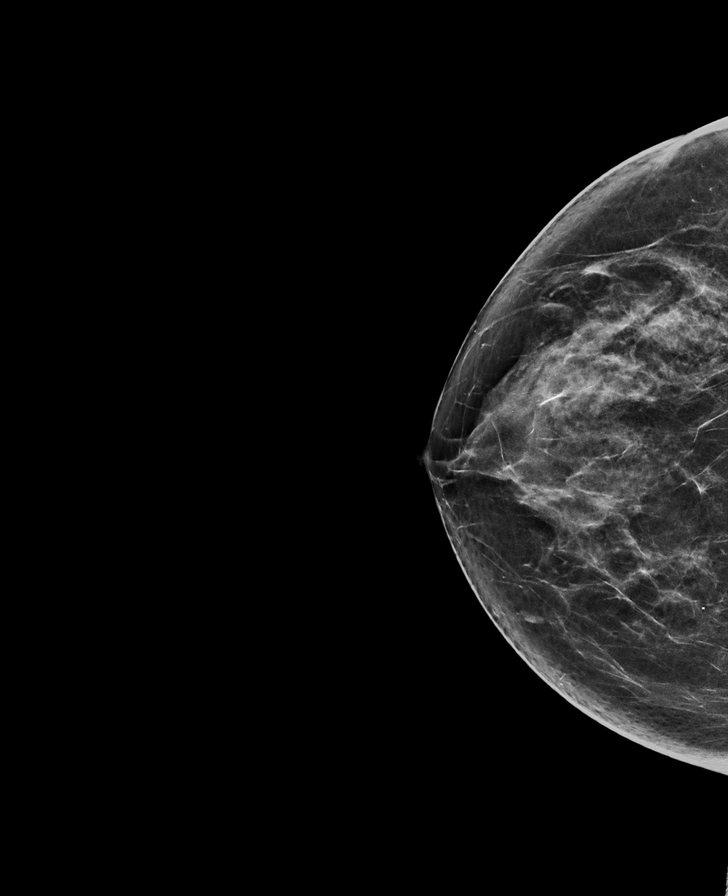

[L MLO synth-2D]
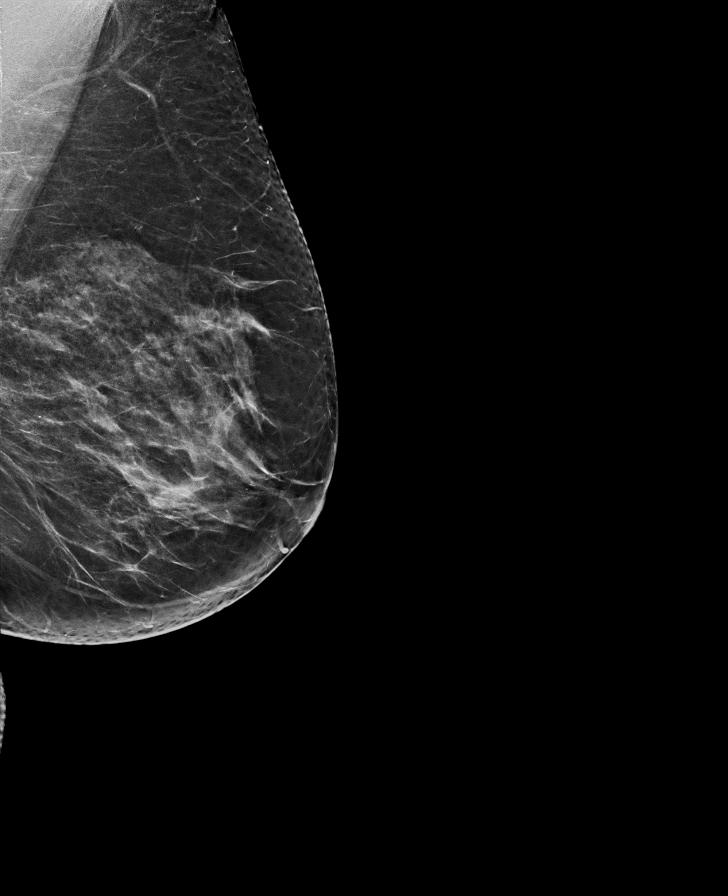

[R MLO synth-2D]
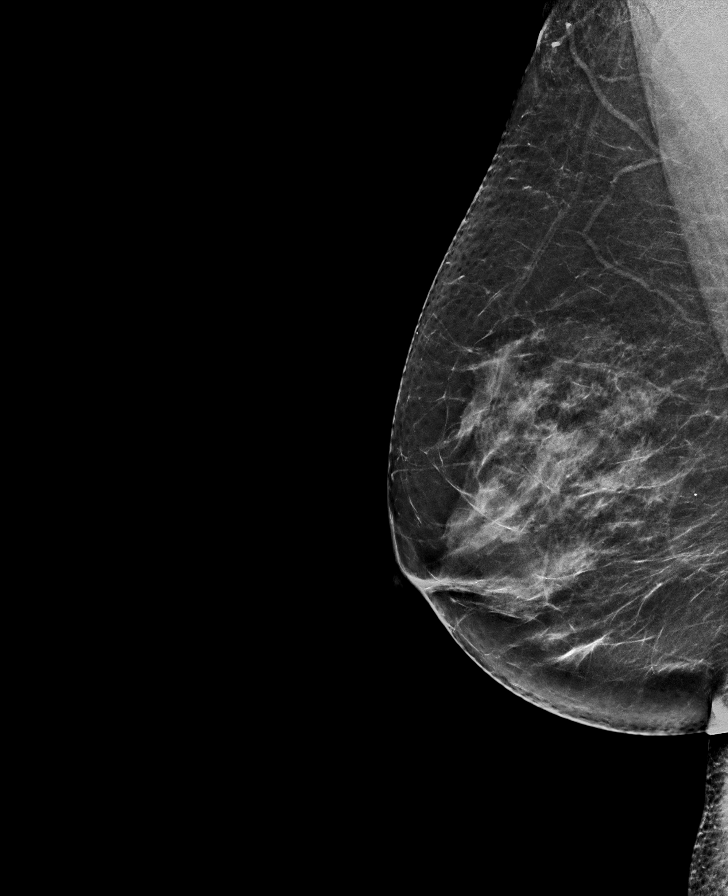

[L CC synth-2D]
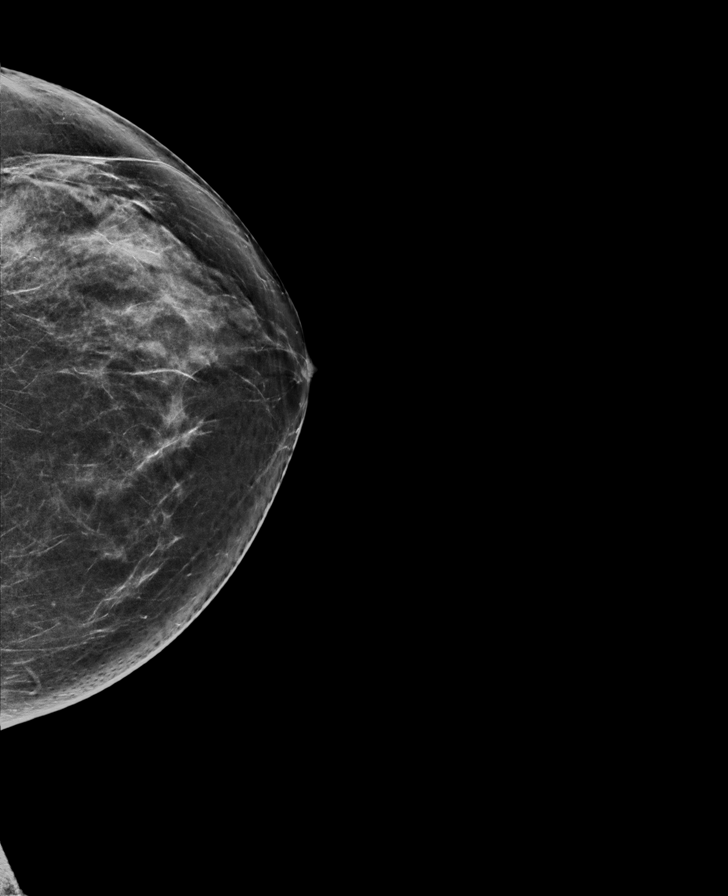

[L CC tomo · tomo slice 37/73.0]
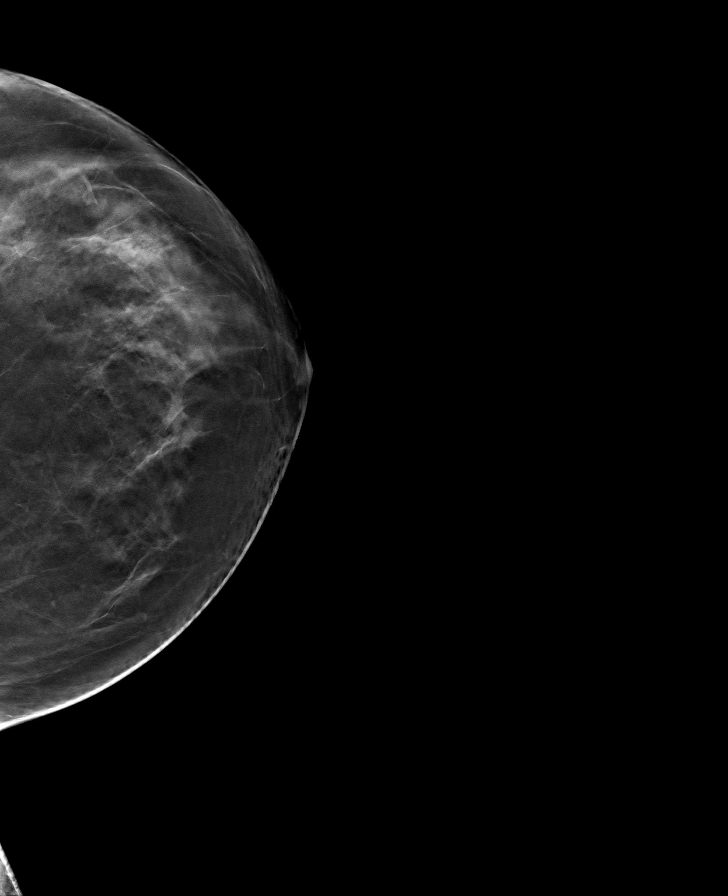

[R CC tomo · tomo slice 35/69.0]
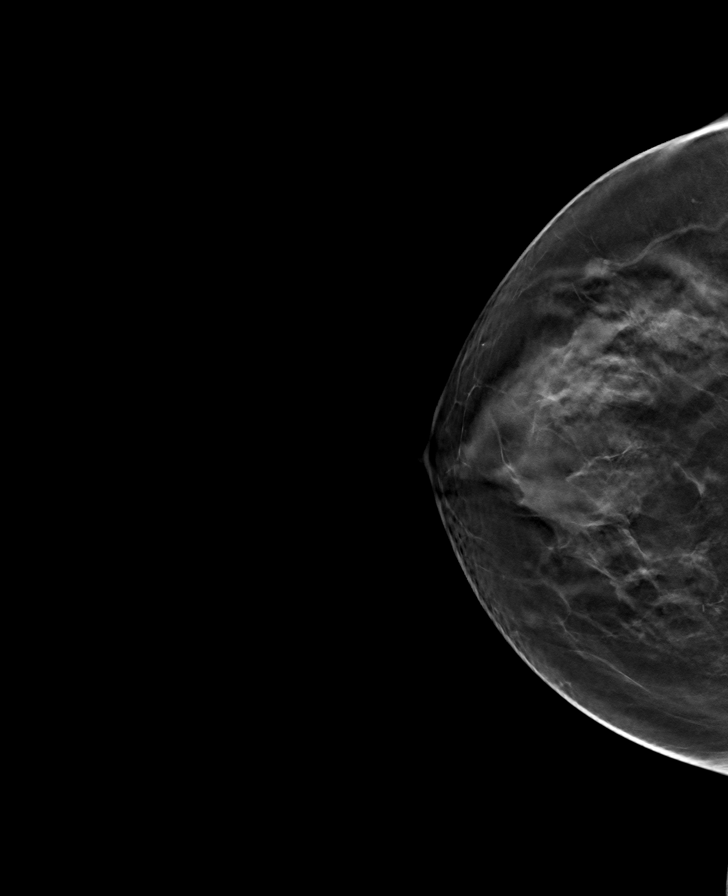

[L MLO tomo · tomo slice 38/75.0]
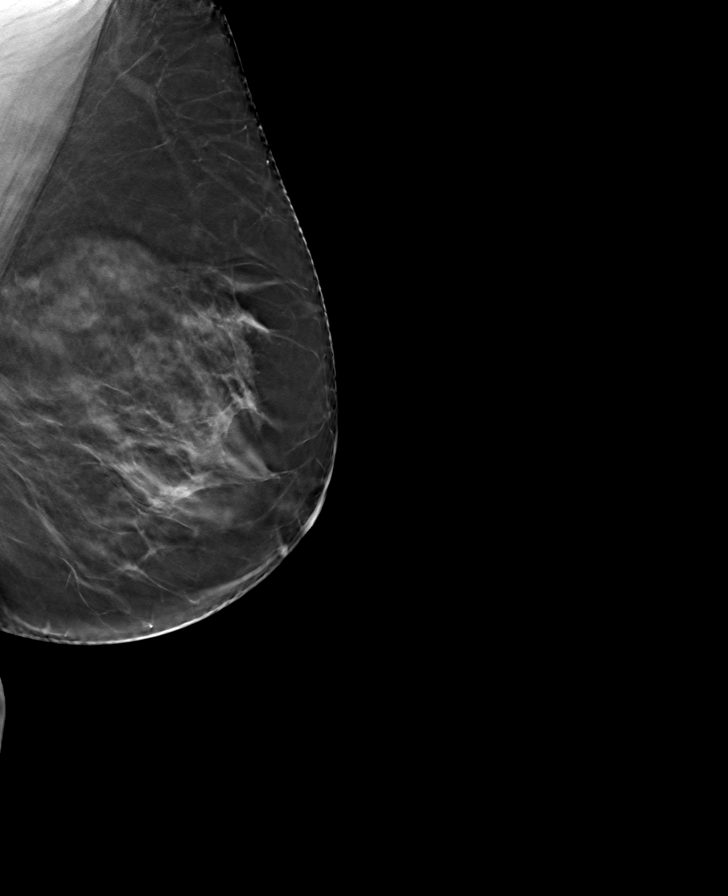

[R MLO tomo · tomo slice 37/72.0]
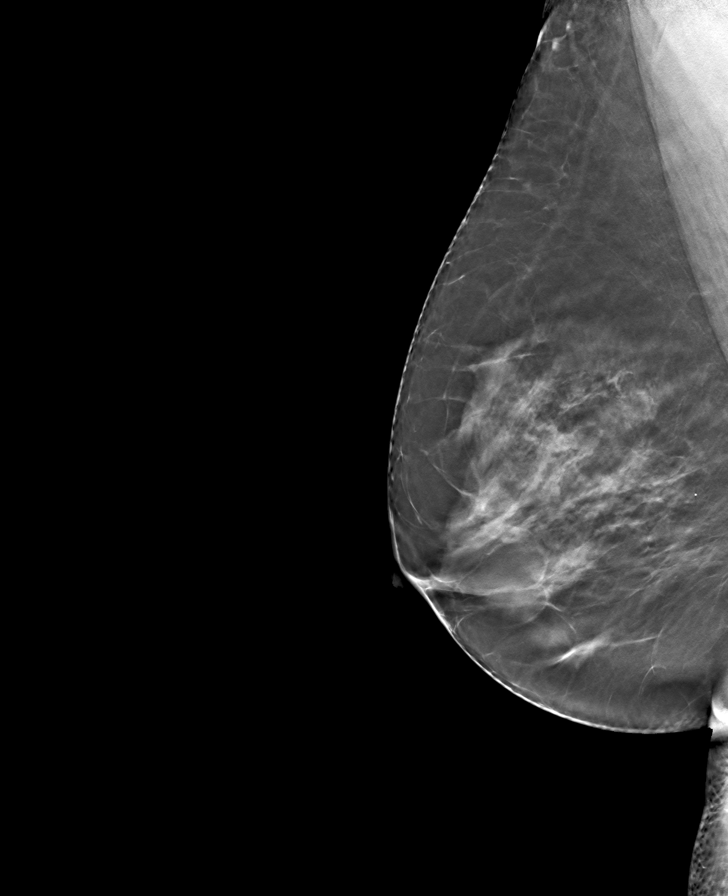

[8 of 24 positions shown; findings below may reference images not displayed]

ACR Breast Density Category c: The breast tissue is heterogeneously
dense, which may obscure small masses.
FINDINGS: There are no findings suspicious for malignancy.
IMPRESSION: No mammographic evidence of malignancy. A result letter of this
screening mammogram will be mailed directly to the patient.

RECOMMENDATION:
Screening mammogram in one year. (Code:Q3-W-BC3)

BI-RADS CATEGORY  1: Negative.

## 2022-12-30 ENCOUNTER — Encounter: Payer: Self-pay | Admitting: Emergency Medicine

## 2022-12-30 ENCOUNTER — Ambulatory Visit
Admission: EM | Admit: 2022-12-30 | Discharge: 2022-12-30 | Disposition: A | Discharge status: Home / Self Care | Payer: Commercial Managed Care - PPO | Attending: Physician Assistant | Admitting: Physician Assistant

## 2022-12-30 DIAGNOSIS — R0981 Nasal congestion: Secondary | ICD-10-CM | POA: Diagnosis present

## 2022-12-30 DIAGNOSIS — J069 Acute upper respiratory infection, unspecified: Secondary | ICD-10-CM | POA: Diagnosis present

## 2022-12-30 DIAGNOSIS — R051 Acute cough: Secondary | ICD-10-CM | POA: Diagnosis present

## 2022-12-30 DIAGNOSIS — H9202 Otalgia, left ear: Secondary | ICD-10-CM | POA: Diagnosis present

## 2022-12-30 HISTORY — DX: Disorder of kidney and ureter, unspecified: N28.9

## 2022-12-30 LAB — RESP PANEL BY RT-PCR (FLU A&B, COVID) ARPGX2
Influenza A by PCR: NEGATIVE
Influenza B by PCR: NEGATIVE
SARS Coronavirus 2 by RT PCR: NEGATIVE

## 2022-12-30 MED ORDER — IPRATROPIUM BROMIDE 0.06 % NA SOLN: 2.0000 | Freq: Four times a day (QID) | NASAL | 0 refills | Status: DC

## 2022-12-30 MED ORDER — PSEUDOEPH-BROMPHEN-DM 30-2-10 MG/5ML PO SYRP: 10.0000 mL | ORAL_SOLUTION | Freq: Four times a day (QID) | ORAL | 0 refills | Status: AC | PRN

## 2022-12-30 NOTE — ED Provider Notes (Signed)
[facility]    CSN: [id number] Arrival date & time: 12/30/22  1404      History   Chief Complaint Chief Complaint  Patient presents with   Sore Throat   Cough    HPI Marisa Figueroa is a 52 y.o. female presenting for sore throat, cough and congestion as well as fatigue, chills x 2 days.  Denies fever.  Reports a little bit of left-sided ear pressure.  No sinus pain.  Sore throat seems of gotten better.  Reports feeling like her chest is congested and she has felt a little short of breath especially when trying to breathe through her nose.  She states that she has used a Engineer, materials pot and Mucinex and it has not helped.  HPI  Past Medical History:  Diagnosis Date   Hyperlipidemia    Renal disorder     Patient Active Problem List   Diagnosis Date Noted   Hyperlipemia 07/24/2018    Past Surgical History:  Procedure Laterality Date   WISDOM TOOTH EXTRACTION      OB History   No obstetric history on file.      Home Medications    Prior to Admission medications   Medication Sig Start Date End Date Taking? Authorizing Provider  brompheniramine-pseudoephedrine-DM 30-2-10 MG/5ML syrup Take 10 mLs by mouth 4 (four) times daily as needed for up to 7 days. 12/30/22 01/06/23 Yes Eusebio Friendly B, PA-C  ipratropium (ATROVENT) 0.06 % nasal spray Place 2 sprays into both nostrils 4 (four) times daily. 12/30/22  Yes Shirlee Latch, PA-C  ketoconazole (NIZORAL) 2 % shampoo Apply 1 application topically 2 (two) times a week.    [provider]  ketorolac (TORADOL) 10 MG tablet Take 1 tablet (10 mg total) by mouth every 8 (eight) hours as needed for severe pain. 08/05/22   Phineas Semen, MD  ondansetron (ZOFRAN) 4 MG tablet Take 1 tablet (4 mg total) by mouth every 8 (eight) hours as needed. 08/05/22   Phineas Semen, MD  oxyCODONE-acetaminophen (PERCOCET) 5-325 MG tablet Take 1 tablet by mouth every 6 (six) hours as needed for severe pain. 08/05/22 08/05/23   Phineas Semen, MD  rosuvastatin (CRESTOR) 5 MG tablet Take 5 mg by mouth daily.    [provider]  tamsulosin (FLOMAX) 0.4 MG CAPS capsule Take 1 capsule (0.4 mg total) by mouth daily. 08/05/22   Phineas Semen, MD    Family History Family History  Problem Relation Age of Onset   Breast cancer Paternal Aunt     Social History Social History   Tobacco Use   Smoking status: Light Smoker    Current packs/day: 0.14    Average packs/day: 0.1 packs/day for 20.0 years (2.8 ttl pk-yrs)    Types: Cigarettes   Smokeless tobacco: Never  Vaping Use   Vaping status: Never Used  Substance Use Topics   Alcohol use: Yes    Comment: social   Drug use: Never     Allergies   Lactose, Lactose intolerance (gi), Penicillins, Simvastatin, and Epinephrine   Review of Systems Review of Systems  Constitutional:  Positive for chills and fatigue. Negative for diaphoresis and fever.  HENT:  Positive for congestion and rhinorrhea. Negative for ear pain, sinus pressure, sinus pain and sore throat.   Respiratory:  Positive for cough and shortness of breath.   Gastrointestinal:  Negative for abdominal pain, nausea and vomiting.  Musculoskeletal:  Negative for arthralgias and myalgias.  Skin:  Negative for rash.  Neurological:  Positive for headaches. Negative for weakness.  Hematological:  Negative for adenopathy.     Physical Exam Triage Vital Signs ED Triage Vitals  Encounter Vitals Group     BP 12/30/22 1438 135/89     Systolic BP Percentile --      Diastolic BP Percentile --      Pulse Rate 12/30/22 1438 88     Resp 12/30/22 1438 15     Temp 12/30/22 1438 98.8 F (37.1 C)     Temp Source 12/30/22 1438 Oral     SpO2 12/30/22 1438 97 %     Weight 12/30/22 1436 160 lb 0.9 oz (72.6 kg)     Height 12/30/22 1436 5' (1.524 m)     Head Circumference --      Peak Flow --      Pain Score 12/30/22 1435 8     Pain Loc --      Pain Education --      Exclude from Growth Chart --     No data found.  Updated Vital Signs BP 135/89 (BP Location: Left Arm)   Pulse 88   Temp 98.8 F (37.1 C) (Oral)   Resp 15   Ht 5' (1.524 m)   Wt 160 lb 0.9 oz (72.6 kg)   SpO2 97%   BMI 31.26 kg/m    Physical Exam Vitals and nursing note reviewed.  Constitutional:      General: She is not in acute distress.    Appearance: Normal appearance. She is not ill-appearing or toxic-appearing.  HENT:     Head: Normocephalic and atraumatic.     Right Ear: Ear canal and external ear normal. A middle ear effusion is present.     Left Ear: Ear canal and external ear normal. A middle ear effusion is present.     Nose: Congestion present.     Mouth/Throat:     Mouth: Mucous membranes are moist.     Pharynx: Oropharynx is clear. Posterior oropharyngeal erythema present.  Eyes:     General: No scleral icterus.       Right eye: No discharge.        Left eye: No discharge.     Conjunctiva/sclera: Conjunctivae normal.  Cardiovascular:     Rate and Rhythm: Normal rate and regular rhythm.     Heart sounds: Normal heart sounds.  Pulmonary:     Effort: Pulmonary effort is normal. No respiratory distress.     Breath sounds: Normal breath sounds.  Musculoskeletal:     Cervical back: Neck supple.  Skin:    General: Skin is dry.  Neurological:     General: No focal deficit present.     Mental Status: She is alert. Mental status is at baseline.     Motor: No weakness.     Gait: Gait normal.  Psychiatric:        Mood and Affect: Mood normal.        Behavior: Behavior normal.        Thought Content: Thought content normal.      UC Treatments / Results  Labs (all labs ordered are listed, but only abnormal results are displayed) Labs Reviewed  RESP PANEL BY RT-PCR (FLU A&B, COVID) [id number]    EKG   Radiology No results found.  Procedures Procedures (including critical care time)  Medications Ordered in UC Medications - No data to display  Initial Impression / Assessment  and Plan / UC Course  I have reviewed the  triage vital signs and the nursing notes.  Pertinent labs & imaging results that were available during my care of the patient were reviewed by me and considered in my medical decision making (see chart for details).   52 year old female presents for 2-day history of fatigue, cough and congestion as well as headaches, shortness of breath, chest pressure and left-sided ear pain.  No recorded fever.  Vitals normal and stable.  She is overall well-appearing.  No acute distress.  Effusion of bilateral TMs, nasal congestion, erythema posterior pharynx.  Chest clear.  Respiratory panel obtained.  All negative.  Reviewed results with patient.  Explained to her that her symptoms are consistent with a viral illness.  She believes she may have bronchitis.  Explained to patient that bronchitis is almost always viral and can last for few weeks.  Care is mostly supportive.  I sent Bromfed-DM and Atrovent nasal spray to pharmacy.  Patient requested a Z-Pak.  Explained to her that this is not indicated at this time but if she develops a fever, worsening shortness of breath, increased fatigue, chest pain or symptoms are not getting better in a couple of weeks she may consider seeking reevaluation and reconsideration of antibiotics.   Final Clinical Impressions(s) / UC Diagnoses   Final diagnoses:  Viral upper respiratory tract infection  Acute cough  Nasal congestion  Left ear pain     Discharge Instructions      -Negative COVID and flu  URI/COLD SYMPTOMS: Your exam today is consistent with a viral illness. Antibiotics are not indicated at this time. Use medications as directed, including cough syrup, nasal saline, and decongestants. Your symptoms should improve over the next few days and resolve within 1-2 weeks. Increase rest and fluids. F/u if symptoms worsen or predominate such as sore throat, ear pain, productive cough, shortness of breath, or if you develop  high fevers or worsening fatigue over the next several days.       ED Prescriptions     Medication Sig Dispense Auth. Provider   brompheniramine-pseudoephedrine-DM 30-2-10 MG/5ML syrup Take 10 mLs by mouth 4 (four) times daily as needed for up to 7 days. 150 mL Eusebio Friendly B, PA-C   ipratropium (ATROVENT) 0.06 % nasal spray Place 2 sprays into both nostrils 4 (four) times daily. 15 mL Shirlee Latch, PA-C      I have reviewed the PDMP during this encounter.   Shirlee Latch, PA-C 12/30/22 1536

## 2022-12-30 NOTE — ED Triage Notes (Signed)
Patient c/o sore throat, cough, chest congestion that started Thursday night. Patient unsure of fevers.

## 2022-12-30 NOTE — Discharge Instructions (Addendum)
-  Negative COVID and flu  URI/COLD SYMPTOMS: Your exam today is consistent with a viral illness. Antibiotics are not indicated at this time. Use medications as directed, including cough syrup, nasal saline, and decongestants. Your symptoms should improve over the next few days and resolve within 1-2 weeks. Increase rest and fluids. F/u if symptoms worsen or predominate such as sore throat, ear pain, productive cough, shortness of breath, or if you develop high fevers or worsening fatigue over the next several days.

## 2023-03-12 ENCOUNTER — Other Ambulatory Visit: Payer: Self-pay | Admitting: Internal Medicine

## 2023-03-12 DIAGNOSIS — Z1231 Encounter for screening mammogram for malignant neoplasm of breast: Secondary | ICD-10-CM

## 2023-03-16 ENCOUNTER — Telehealth: Payer: Self-pay | Admitting: Urology

## 2023-03-16 NOTE — Telephone Encounter (Addendum)
 Pt states she does not have any kidney stones symptoms, but wanted to schedule a follow up on her kidney stones with an X-Ray. Pt states she will be out the country during the second week of April and wanted to see Dr.Brandon before then. Pt was advise we do not have an opening with Dr.Brandon until the end of April. Pt was advise to give [place] a call if she develops any kidney stone symptoms such a blood in urine and back pain, and we would get her scheduled with a PA. Pt was insistent on seeing Dr.Brandon instead and having a message sent. Pt will keep follow up for July 2025.  Pt wanted to be seen sooner. Please advise.

## 2023-03-16 NOTE — Telephone Encounter (Signed)
 PT called and wanted to schedule there 1 year f/u for next available. Pt is concerned about kidney stone and wants to get an scan(KUB) done as soon as possible. PT next appt is 08/03/23

## 2023-03-19 ENCOUNTER — Ambulatory Visit
Admission: RE | Admit: 2023-03-19 | Discharge: 2023-03-19 | Disposition: A | Discharge status: Home / Self Care | Source: Ambulatory Visit | Attending: Internal Medicine | Admitting: Internal Medicine

## 2023-03-19 DIAGNOSIS — Z1231 Encounter for screening mammogram for malignant neoplasm of breast: Secondary | ICD-10-CM

## 2023-03-19 NOTE — Telephone Encounter (Signed)
 Please add her to my waiting list  Marisa Scotland, MD

## 2023-03-26 ENCOUNTER — Other Ambulatory Visit: Payer: Self-pay | Admitting: Family Medicine

## 2023-03-26 DIAGNOSIS — R928 Other abnormal and inconclusive findings on diagnostic imaging of breast: Secondary | ICD-10-CM

## 2023-03-29 ENCOUNTER — Encounter

## 2023-03-29 ENCOUNTER — Ambulatory Visit
Admission: RE | Admit: 2023-03-29 | Discharge: 2023-03-29 | Disposition: A | Discharge status: Home / Self Care | Source: Ambulatory Visit | Attending: Family Medicine | Admitting: Family Medicine

## 2023-03-29 DIAGNOSIS — R928 Other abnormal and inconclusive findings on diagnostic imaging of breast: Secondary | ICD-10-CM | POA: Diagnosis present

## 2023-04-02 ENCOUNTER — Encounter

## 2023-04-09 ENCOUNTER — Telehealth: Payer: Self-pay

## 2023-04-09 ENCOUNTER — Other Ambulatory Visit: Payer: Self-pay | Admitting: Internal Medicine

## 2023-04-09 DIAGNOSIS — R928 Other abnormal and inconclusive findings on diagnostic imaging of breast: Secondary | ICD-10-CM

## 2023-04-09 DIAGNOSIS — R921 Mammographic calcification found on diagnostic imaging of breast: Secondary | ICD-10-CM

## 2023-04-09 NOTE — Telephone Encounter (Signed)
 The patient called to confirm that her appointment for today was canceled. She mentioned that she called on Friday to cancel and left a voicemail. I informed the patient that she was supposed to call [facility] and transferred the call.

## 2023-04-19 ENCOUNTER — Ambulatory Visit
Admission: RE | Admit: 2023-04-19 | Discharge: 2023-04-19 | Disposition: A | Discharge status: Home / Self Care | Source: Ambulatory Visit | Attending: Internal Medicine | Admitting: Internal Medicine

## 2023-04-19 DIAGNOSIS — D0511 Intraductal carcinoma in situ of right breast: Secondary | ICD-10-CM | POA: Diagnosis present

## 2023-04-19 DIAGNOSIS — R921 Mammographic calcification found on diagnostic imaging of breast: Secondary | ICD-10-CM | POA: Insufficient documentation

## 2023-04-19 DIAGNOSIS — R928 Other abnormal and inconclusive findings on diagnostic imaging of breast: Secondary | ICD-10-CM | POA: Insufficient documentation

## 2023-04-19 HISTORY — PX: BREAST BIOPSY: SHX20

## 2023-04-19 MED ORDER — LIDOCAINE HCL (PF) 1 % IJ SOLN
5.0000 mL | Freq: Once | INTRAMUSCULAR | Status: AC
  Administered 2023-04-19: 5 mL

## 2023-04-19 MED ORDER — LIDOCAINE 1 % OPTIME INJ - NO CHARGE
5.0000 mL | Freq: Once | INTRAMUSCULAR | Status: DC
  Filled 2023-04-19: qty 6

## 2023-04-19 MED ORDER — LIDOCAINE-EPINEPHRINE 1 %-1:100000 IJ SOLN
10.0000 mL | Freq: Once | INTRAMUSCULAR | Status: AC
  Administered 2023-04-19: 10 mL

## 2023-04-19 MED ORDER — LIDOCAINE 1 % OPTIME INJ - NO CHARGE
5.0000 mL | Freq: Once | INTRAMUSCULAR | Status: AC
  Administered 2023-04-19: 5 mL
  Filled 2023-04-19: qty 6

## 2023-04-19 MED ORDER — LIDOCAINE HCL (PF) 1 % IJ SOLN
5.0000 mL | Freq: Once | INTRAMUSCULAR | Status: AC
Start: 2023-04-19 — End: 2023-04-19
  Administered 2023-04-19: 5 mL

## 2023-04-20 ENCOUNTER — Encounter: Payer: Self-pay | Admitting: *Deleted

## 2023-04-20 LAB — SURGICAL PATHOLOGY

## 2023-04-20 NOTE — Progress Notes (Signed)
 Received referral for newly diagnosed breast cancer from [facility].  Navigation initiated.  Discussed surgeons and medical oncologists.   She is going to decide who she would like to see and call me back on Monday.

## 2023-04-24 ENCOUNTER — Encounter: Payer: Self-pay | Admitting: *Deleted

## 2023-04-24 NOTE — Progress Notes (Signed)
 Attempted to call Ms. Rusher to see if she has decided which MD's she would like to see.   She didn't answer and her VM is full.  Will attempt to call again later.

## 2023-04-25 ENCOUNTER — Encounter: Payer: Self-pay | Admitting: *Deleted

## 2023-04-25 NOTE — Progress Notes (Signed)
 Touched base with Marisa Figueroa.   She is still figuring out which providers she would like to see.   She will call me back when she decides.  She is going to be out of town 4/17-4/29.

## 2023-05-11 DIAGNOSIS — D0511 Intraductal carcinoma in situ of right breast: Secondary | ICD-10-CM | POA: Insufficient documentation

## 2023-05-17 ENCOUNTER — Other Ambulatory Visit: Payer: Self-pay | Admitting: General Surgery

## 2023-05-17 DIAGNOSIS — D0511 Intraductal carcinoma in situ of right breast: Secondary | ICD-10-CM

## 2023-05-18 ENCOUNTER — Ambulatory Visit
Admission: RE | Admit: 2023-05-18 | Discharge: 2023-05-18 | Disposition: A | Discharge status: Home / Self Care | Source: Ambulatory Visit | Attending: General Surgery | Admitting: General Surgery

## 2023-05-18 ENCOUNTER — Encounter: Payer: Self-pay | Admitting: Oncology

## 2023-05-18 DIAGNOSIS — D0511 Intraductal carcinoma in situ of right breast: Secondary | ICD-10-CM | POA: Insufficient documentation

## 2023-05-18 MED ORDER — GADOBUTROL 1 MMOL/ML IV SOLN
7.0000 mL | Freq: Once | INTRAVENOUS | Status: AC | PRN
  Administered 2023-05-18: 7 mL via INTRAVENOUS

## 2023-05-21 ENCOUNTER — Encounter: Payer: Self-pay | Admitting: *Deleted

## 2023-05-21 NOTE — Progress Notes (Signed)
 Ms. Bieber will see Dr. Adrian Alba on 5/14 at 11:15.

## 2023-05-23 ENCOUNTER — Inpatient Hospital Stay: Attending: Oncology | Admitting: Oncology

## 2023-05-23 ENCOUNTER — Inpatient Hospital Stay

## 2023-05-23 ENCOUNTER — Encounter: Payer: Self-pay | Admitting: Oncology

## 2023-05-23 VITALS — BP 160/91 | HR 75 | Temp 98.2°F | Resp 16 | Ht 60.0 in | Wt 160.0 lb

## 2023-05-23 DIAGNOSIS — D0511 Intraductal carcinoma in situ of right breast: Secondary | ICD-10-CM | POA: Diagnosis not present

## 2023-05-23 DIAGNOSIS — Z170 Estrogen receptor positive status [ER+]: Secondary | ICD-10-CM | POA: Diagnosis not present

## 2023-05-23 NOTE — Progress Notes (Signed)
 [facility]  Telephone:([phone] [phone]  ID: Marisa Figueroa OB: 10/26/1970  MR#: [id number]  QIO#:[id number]  Patient Care Team: Sari Cunning, MD as PCP - General (Internal Medicine) [name], RN as Oncology Nurse Navigator Adrian Alba, Deadra Everts, MD as Consulting Physician (Oncology)  CHIEF COMPLAINT: DCIS right breast.  INTERVAL HISTORY: Patient is a 53 year old female who recently had an abnormal mammogram that subsequently led to ultrasound and biopsy revealing the above-stated pathology.  She currently feels well and is asymptomatic.  She has no neurologic complaints.  She denies any recent fevers or illnesses.  She has a good appetite and denies weight loss.  She has no chest pain, shortness of breath, cough, or hemoptysis.  She denies any nausea, vomiting, constipation, or diarrhea.  She has no urinary complaints.  Patient offers no specific complaints today.  REVIEW OF SYSTEMS:   Review of Systems  Constitutional: Negative.  Negative for fever, malaise/fatigue and weight loss.  Respiratory: Negative.  Negative for cough, hemoptysis and shortness of breath.   Cardiovascular: Negative.  Negative for chest pain and leg swelling.  Gastrointestinal: Negative.  Negative for abdominal pain.  Genitourinary: Negative.  Negative for dysuria.  Musculoskeletal: Negative.  Negative for back pain.  Skin: Negative.  Negative for rash.  Neurological: Negative.  Negative for dizziness, focal weakness, weakness and headaches.  Psychiatric/Behavioral: Negative.  The patient is not nervous/anxious.     As per HPI. Otherwise, a complete review of systems is negative.  PAST MEDICAL HISTORY: Past Medical History:  Diagnosis Date   Hyperlipidemia    Renal disorder     PAST SURGICAL HISTORY: Past Surgical History:  Procedure Laterality Date   BREAST BIOPSY Right 04/19/2023   MM RT BREAST BX W LOC DEV 1ST LESION IMAGE BX SPEC STEREO GUIDE 04/19/2023  [facility]   BREAST BIOPSY Right 04/19/2023   MM RT BREAST BX W LOC DEV EA AD LESION IMG BX SPEC STEREO GUIDE 04/19/2023 [facility]   WISDOM TOOTH EXTRACTION      FAMILY HISTORY: Family History  Problem Relation Age of Onset   Cancer Mother        melanoma   Cancer Father        prostate   Breast cancer Paternal Aunt     ADVANCED DIRECTIVES (Y/N):  N  HEALTH MAINTENANCE: Social History   Tobacco Use   Smoking status: Light Smoker    Current packs/day: 0.14    Average packs/day: 0.1 packs/day for 20.0 years (2.8 ttl pk-yrs)    Types: Cigarettes   Smokeless tobacco: Never  Vaping Use   Vaping status: Never Used  Substance Use Topics   Alcohol use: Yes    Comment: social   Drug use: Never     Colonoscopy:  PAP:  Bone density:  Lipid panel:  Allergies  Allergen Reactions   Lactose Other (See Comments)    Gi upset   Lactose Intolerance (Gi)    Penicillins Itching   Simvastatin Other (See Comments)    Other reaction(s): Unknown   Epinephrine  Palpitations    Current Outpatient Medications  Medication Sig Dispense Refill   ketoconazole (NIZORAL) 2 % shampoo Apply 1 application topically 2 (two) times a week.     rosuvastatin (CRESTOR) 5 MG tablet Take 5 mg by mouth daily.     tamsulosin  (FLOMAX ) 0.4 MG CAPS capsule Take 1 capsule (0.4 mg total) by mouth daily. 14 capsule 0   ketorolac  (TORADOL ) 10 MG tablet Take 1 tablet (10  mg total) by mouth every 8 (eight) hours as needed for severe pain. (Patient not taking: Reported on 05/23/2023) 20 tablet 0   ondansetron  (ZOFRAN ) 4 MG tablet Take 1 tablet (4 mg total) by mouth every 8 (eight) hours as needed. (Patient not taking: Reported on 05/23/2023) 20 tablet 0   oxyCODONE -acetaminophen  (PERCOCET) 5-325 MG tablet Take 1 tablet by mouth every 6 (six) hours as needed for severe pain. (Patient not taking: Reported on 05/23/2023) 10 tablet 0   venlafaxine (EFFEXOR) 25 MG tablet Take 1 tablet by mouth daily. (Patient  not taking: Reported on 05/23/2023)     No current facility-administered medications for this visit.    OBJECTIVE: Vitals:   05/23/23 1134  BP: (!) 160/91  Pulse: 75  Resp: 16  Temp: 98.2 F (36.8 C)  SpO2: 99%     Body mass index is 31.25 kg/m.    ECOG FS:0 - Asymptomatic  General: Well-developed, well-nourished, no acute distress. Eyes: Pink conjunctiva, anicteric sclera. HEENT: Normocephalic, moist mucous membranes. Lungs: No audible wheezing or coughing. Heart: Regular rate and rhythm. Abdomen: Soft, nontender, no obvious distention. Musculoskeletal: No edema, cyanosis, or clubbing. Neuro: Alert, answering all questions appropriately. Cranial nerves grossly intact. Skin: No rashes or petechiae noted. Psych: Normal affect. Lymphatics: No cervical, calvicular, axillary or inguinal LAD.   LAB RESULTS:  Lab Results  Component Value Date   NA 136 08/05/2022   K 3.4 (L) 08/05/2022   CL 104 08/05/2022   CO2 21 (L) 08/05/2022   GLUCOSE 108 (H) 08/05/2022   BUN 14 08/05/2022   CREATININE 0.75 08/05/2022   CALCIUM 9.1 08/05/2022   PROT 7.5 08/05/2022   ALBUMIN 4.6 08/05/2022   AST 21 08/05/2022   ALT 15 08/05/2022   ALKPHOS 61 08/05/2022   BILITOT 0.6 08/05/2022   GFRNONAA >60 08/05/2022    Lab Results  Component Value Date   WBC 9.7 08/05/2022   HGB 14.7 08/05/2022   HCT 44.3 08/05/2022   MCV 87.0 08/05/2022   PLT 233 08/05/2022     STUDIES: MR BREAST BILATERAL W WO CONTRAST INC CAD Result Date: 05/21/2023 CLINICAL DATA:  53 year old female with recent diagnosis of UPPER-OUTER RIGHT breast DCIS, with DCIS calcifications spanning a distance of 2.5 cm. EXAM: BILATERAL BREAST MRI WITH AND WITHOUT CONTRAST TECHNIQUE: Multiplanar, multisequence MR images of both breasts were obtained prior to and following the intravenous administration of 7 ml of Gadavist Three-dimensional MR images were rendered by post-processing of the original MR data on an independent  workstation. The three-dimensional MR images were interpreted, and findings are reported in the following complete MRI report for this study. Three dimensional images were evaluated at the independent interpreting workstation using the DynaCAD thin client. COMPARISON:  Previous exam(s). FINDINGS: Breast composition: c. Heterogeneous fibroglandular tissue. Background parenchymal enhancement: Mild Right breast: 3.5 x 2 x 1.5 cm (AP x transverse x CC) low level non masslike enhancement is identified middle depth UPPER OUTER RIGHT breast extending to the posterior RIGHT breast compatible with biopsy-proven DCIS/post biopsy changes (images 37-48: Series 15). Biopsy clip artifacts along the anterolateral aspect (X clip) and posterosuperior aspect (RIBBON clip) of the non masslike enhancement/biopsy-proven DCIS identified. No other suspicious abnormalities are noted within the RIGHT breast. Left breast: No suspicious mass or worrisome enhancement. Lymph nodes: No abnormal appearing lymph nodes. Ancillary findings:  None. IMPRESSION: 1. 3.5 cm area of biopsy-proven DCIS within the UPPER OUTER to posterior RIGHT breast, demarcated by biopsy clip artifacts. No other suspicious abnormalities within  either breast. No suspicious appearing lymph nodes. RECOMMENDATION: Treatment plan BI-RADS CATEGORY  6: Known biopsy-proven malignancy. Electronically Signed   By: Sundra Engel M.D.   On: 05/21/2023 09:51    ASSESSMENT: DCIS right breast.  PLAN:    DCIS right breast: Imaging and pathology reviewed independently.  Patient also had a breast MRI on May 18, 2023.  No pathologically enlarged lymph nodes were noted.  Patient plans to have lumpectomy in the near future.  Since there is no invasive component of her disease, chemotherapy is not recommended.  She will benefit from adjuvant XRT.  ER/PR will be sent off her surgical specimen and patient will likely require 5 years of tamoxifen at the completion of her treatments.  No  further intervention is needed.  Return to clinic 2 weeks after her surgery for further evaluation and radiation oncology consultation.  I spent a total of 60 minutes reviewing chart data, face-to-face evaluation with the patient, counseling and coordination of care as detailed above.   Patient expressed understanding and was in agreement with this plan. She also understands that She can call clinic at any time with any questions, concerns, or complaints.    Cancer Staging  Ductal carcinoma in situ (DCIS) of right breast Staging form: Breast, AJCC 8th Edition - Clinical stage from 05/23/2023: Stage 0 (cTis (DCIS), cN0, cM0, G2, ER+, PR+, HER2-) - Signed by Shellie Dials, MD on 05/23/2023 Stage prefix: Initial diagnosis Histologic grading system: 3 grade system   Shellie Dials, MD   05/23/2023 4:40 PM

## 2023-06-08 ENCOUNTER — Other Ambulatory Visit: Payer: Self-pay | Admitting: General Surgery

## 2023-06-08 DIAGNOSIS — D0511 Intraductal carcinoma in situ of right breast: Secondary | ICD-10-CM

## 2023-06-13 ENCOUNTER — Inpatient Hospital Stay

## 2023-06-14 ENCOUNTER — Ambulatory Visit: Payer: Self-pay | Admitting: General Surgery

## 2023-06-14 NOTE — H&P (Signed)
 History of Present Illness Marisa Figueroa is a 53 year old female who presents for evaluation of right breast cancer. She was referred by Dr. Annabell Key for further evaluation.  She underwent a routine bilateral screening mammogram on March 18, 2033, which revealed calcifications in the right breast. A subsequent diagnostic mammogram confirmed two groups of calcifications in the right breast: a 15 mm cluster of amorphous calcifications in the upper outer quadrant and a 3 mm cluster located approximately 15 mm posterior to the first group.  A core biopsy of both groups of calcifications revealed ductal carcinoma in situ (DCIS), intermediate grade. She describes the biopsy procedure as 'awful' and experienced a reaction to the bandages, causing skin irritation. She is currently experiencing swollen and tender lymph nodes on the affected side.  She has no family history of breast cancer, but her mother and father both had melanoma. Her mother passed away from melanoma two years ago after it became systemic.  She began menstruating at age 10 and entered menopause about five years ago. She has not had children and has not breastfed. She has not taken hormone replacement therapy due to concerns about estrogen-driven cancers, but she is using a "homeopathic hormone". She is focused on weight management and is considering dietary changes to reduce sugar intake.   PAST MEDICAL HISTORY:  Past Medical History:  Diagnosis Date  Dyslipidemia  Other specified dyspareunia     PAST SURGICAL HISTORY:  Past Surgical History:  Procedure Laterality Date  COLONOSCOPY 01/31/2021  Normal/Rpt27yrs/TKT    MEDICATIONS:  Outpatient Encounter Medications as of 05/17/2023  Medication Sig Dispense Refill  albuterol 90 mcg/actuation inhaler Inhale 2 inhalations into the lungs every 6 (six) hours as needed for Wheezing 1 each 1  COQ10, LIPOSOMAL UBIQUINOL, ORAL Take by mouth once daily  CYANOCOBALAMIN, VITAMIN  B-12, ORAL Take by mouth With folic acid one daily  ketoconazole (NIZORAL) 2 % shampoo Apply topically once a week 120 mL 8  multivitamin tablet Take 1 tablet by mouth once daily  rosuvastatin (CRESTOR) 10 MG tablet Take 1 tablet (10 mg total) by mouth once daily 90 tablet 3  TURMERIC ORAL Take by mouth once daily  venlafaxine (EFFEXOR) 25 MG tablet Take 1 tablet (25 mg total) by mouth once daily 30 tablet 11   No facility-administered encounter medications on file as of 05/17/2023.    ALLERGIES:  Epinephrine , Lactose, Penicillins, Simvastatin, and Simvastatin  SOCIAL HISTORY:  Social History   Socioeconomic History  Marital status: Married  Occupational History  Occupation: Forensic psychologist  Tobacco Use  Smoking status: Every Day  Current packs/day: 0.00  Types: Cigarettes  Last attempt to quit: 02/24/2013  Years since quitting: 10.2  Passive exposure: Current  Smokeless tobacco: Never  Tobacco comments:  smokes "socially"  Vaping Use  Vaping status: Never Used  Substance and Sexual Activity  Alcohol use: Yes  Alcohol/week: 4.0 standard drinks of alcohol  Types: 4 Glasses of wine per week  Drug use: No  Sexual activity: Yes  Partners: Male  Birth control/protection: Post-menopausal  Social History Narrative  Feels safe in home.   Social Drivers of Corporate investment banker Strain: Low Risk (05/17/2023)  Overall Financial Resource Strain (CARDIA)  Difficulty of Paying Living Expenses: Not hard at all  Food Insecurity: No Food Insecurity (05/17/2023)  Hunger Vital Sign  Worried About Running Out of Food in the Last Year: Never true  Ran Out of Food in the Last Year: Never true  Transportation Needs:  No Transportation Needs (05/17/2023)  PRAPARE - Risk analyst (Medical): No  Lack of Transportation (Non-Medical): No   FAMILY HISTORY:  Family History  Problem Relation Name Age of Onset  High blood pressure (Hypertension) Mother   Hyperlipidemia (Elevated cholesterol) Mother  Thyroid disease Mother  High blood pressure (Hypertension) Father  Hyperlipidemia (Elevated cholesterol) Father  Myocardial Infarction (Heart attack) Father 17    GENERAL REVIEW OF SYSTEMS:   General ROS: negative for - chills, fatigue, fever, weight gain or weight loss Allergy and Immunology ROS: negative for - hives  Hematological and Lymphatic ROS: negative for - bleeding problems or bruising, negative for palpable nodes Endocrine ROS: negative for - heat or cold intolerance, hair changes Respiratory ROS: negative for - cough, shortness of breath or wheezing Cardiovascular ROS: no chest pain or palpitations GI ROS: negative for nausea, vomiting, abdominal pain, diarrhea, constipation Musculoskeletal ROS: negative for - joint swelling or muscle pain Neurological ROS: negative for - confusion, syncope Dermatological ROS: negative for pruritus and rash  PHYSICAL EXAM:  Vitals:  05/17/23 0851  BP: (!) 138/93  Pulse: 69  .  Ht:152.4 cm (5') Wt:75.8 kg (167 lb) WJX:BJYN surface area is 1.79 meters squared. Body mass index is 32.61 kg/m.[name]  GENERAL: Alert, active, oriented x3  HEENT: Pupils equal reactive to light. Extraocular movements are intact. Sclera clear. Palpebral conjunctiva normal red color.Pharynx clear.  NECK: Supple with no palpable mass and no adenopathy.  LUNGS: Sound clear with no rales rhonchi or wheezes.  HEART: Regular rhythm S1 and S2 without murmur.  BREAST: Breast examined in the supine position. There is no palpable masses, skin changes, nipple retraction or nipple discharge. There is no axillary adenopathy bilaterally.  EXTREMITIES: Well-developed well-nourished symmetrical with no dependent edema.  NEUROLOGICAL: Awake alert oriented, facial expression symmetrical, moving all extremities.  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 The first cluster measures 50 mm, and the second is 3 mm, located 15 mm posterior to the first. DCIS is stage 0, indicating no metastasis potential. There is concern about additional disease between the clusters or elsewhere in the breast. She is interested in breast conservation and considering a lumpectomy. Standard treatment options, including surgery, radiation, and hormone therapy, were discussed. She prefers alternative treatments but remains open to standard care based on further findings. The potential side effects of hormone therapy and the importance of comprehensive information before decision-making were discussed. An MRI is recommended to ensure no additional disease is present. Achieving clear margins in the first surgery occurs in 90% of cases, but additional surgery may be necessary if margins are not clear. Refer to Dr. Adrian Alba, oncologist, for further discussion on radiation and hormone therapy options. Discussed the potential need for additional surgery if margins are not clear. Advise on post-operative care, including the use of a supportive bra, ice packs, and pain management. Discuss potential side effects of radiation and hormone therapy with the oncologist.  Ductal carcinoma in situ (DCIS)  of right breast [D05.11]  Patient and her best friend verbalized understanding, all questions were answered, and were agreeable with the plan outlined above.   Eldred Grego,  MD

## 2023-06-19 ENCOUNTER — Other Ambulatory Visit: Payer: Self-pay

## 2023-06-19 ENCOUNTER — Ambulatory Visit: Payer: Self-pay | Admitting: General Surgery

## 2023-06-19 ENCOUNTER — Encounter
Admission: RE | Admit: 2023-06-19 | Discharge: 2023-06-19 | Disposition: A | Discharge status: Home / Self Care | Source: Ambulatory Visit | Attending: General Surgery | Admitting: General Surgery

## 2023-06-19 HISTORY — DX: Intraductal carcinoma in situ of right breast: D05.11

## 2023-06-19 HISTORY — DX: Personal history of urinary calculi: Z87.442

## 2023-06-19 NOTE — Patient Instructions (Addendum)
 Your procedure is scheduled on:06-20-23 Wednesday Report to [facility] @ 7:45 AM  REMEMBER: Instructions that are not followed completely may result in serious medical risk, up to and including death; or upon the discretion of your surgeon and anesthesiologist your surgery may need to be rescheduled.  Do not eat food OR drink any liquids after midnight the night before surgery.  No gum chewing or hard candies  One week prior to surgery:Stop NOW (06-19-23) Stop Anti-inflammatories (NSAIDS) such as Advil, Aleve, Ibuprofen, Motrin, Naproxen, Naprosyn and Aspirin based products such as Excedrin, Goody's Powder, BC Powder. Stop ANY OVER THE COUNTER supplements until after surgery.  You may however, continue to take Tylenol  if needed for pain up until the day of surgery  Continue taking all of your other prescription medications up until the day of surgery.  Do NOT take any medication the day of surgery  Bring your Albuterol Inhaler to the hospital  No Alcohol for 24 hours before or after surgery.  No Smoking including e-cigarettes for 24 hours before surgery.  No chewable tobacco products for at least 6 hours before surgery.  No nicotine patches on the day of surgery.  Do not use any "recreational" drugs for at least a week (preferably 2 weeks) before your surgery.  Please be advised that the combination of cocaine and anesthesia may have negative outcomes, up to and including death. If you test positive for cocaine, your surgery will be cancelled.  On the morning of surgery brush your teeth with toothpaste and water, you may rinse your mouth with mouthwash if you wish. Do not swallow any toothpaste or mouthwash.  Use CHG Soap as directed on instruction sheet.  Do not wear jewelry, make-up, hairpins, clips or nail polish.  For welded (permanent) jewelry: bracelets, anklets, waist bands, etc.  Please have this removed prior to surgery.  If it is not removed, there is a  chance that hospital personnel will need to cut it off on the day of surgery.  Do not wear lotions, powders, or perfumes.   Do not shave body hair from the neck down 48 hours before surgery.  Contact lenses, hearing aids and dentures may not be worn into surgery.  Do not bring valuables to the hospital. [facility] is not responsible for any missing/lost belongings or valuables.   Notify your doctor if there is any change in your medical condition (cold, fever, infection).  Wear comfortable clothing (specific to your surgery type) to the hospital.  After surgery, you can help prevent lung complications by doing breathing exercises.  Take deep breaths and cough every 1-2 hours. Your doctor may order a device called an Incentive Spirometer to help you take deep breaths. When coughing or sneezing, hold a pillow firmly against your incision with both hands. This is called "splinting." Doing this helps protect your incision. It also decreases belly discomfort.  If you are being admitted to the hospital overnight, leave your suitcase in the car. After surgery it may be brought to your room.  In case of increased patient census, it may be necessary for you, the patient, to continue your postoperative care in the Same Day Surgery department.  If you are being discharged the day of surgery, you will not be allowed to drive home. You will need a responsible individual to drive you home and stay with you for 24 hours after surgery.   If you are taking public transportation, you will need to have a responsible individual with  you.  Please call the Pre-admissions Testing Dept. at [phone] if you have any questions about these instructions.  Surgery Visitation Policy:  Patients having surgery or a procedure may have two visitors.  Children under the age of 25 must have an adult with them who is not the patient.     Preparing for Surgery with CHLORHEXIDINE GLUCONATE (CHG)  Soap  Chlorhexidine Gluconate (CHG) Soap  o An antiseptic cleaner that kills germs and bonds with the skin to continue killing germs even after washing  o Used for showering the night before surgery and morning of surgery  Before surgery, you can play an important role by reducing the number of germs on your skin.  CHG (Chlorhexidine gluconate) soap is an antiseptic cleanser which kills germs and bonds with the skin to continue killing germs even after washing.  Please do not use if you have an allergy to CHG or antibacterial soaps. If your skin becomes reddened/irritated stop using the CHG.  1. Shower the NIGHT BEFORE SURGERY and the MORNING OF SURGERY with CHG soap.  2. If you choose to wash your hair, wash your hair first as usual with your normal shampoo.  3. After shampooing, rinse your hair and body thoroughly to remove the shampoo.  4. Use CHG as you would any other liquid soap. You can apply CHG directly to the skin and wash gently with a scrungie or a clean washcloth.  5. Apply the CHG soap to your body only from the neck down. Do not use on open wounds or open sores. Avoid contact with your eyes, ears, mouth, and genitals (private parts). Wash face and genitals (private parts) with your normal soap.  6. Wash thoroughly, paying special attention to the area where your surgery will be performed.  7. Thoroughly rinse your body with warm water.  8. Do not shower/wash with your normal soap after using and rinsing off the CHG soap.  9. Pat yourself dry with a clean towel.  10. Wear clean pajamas to bed the night before surgery.  12. Place clean sheets on your bed the night of your first shower and do not sleep with pets.  13. Shower again with the CHG soap on the day of surgery prior to arriving at the hospital.  14. Do not apply any deodorants/lotions/powders.  15. Please wear clean clothes to the hospital.

## 2023-06-19 NOTE — H&P (Signed)
 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 A subsequent biopsy confirmed intermediate-grade DCIS. An MRI was performed to assess the extent of the disease, which did not reveal any additional findings beyond those seen on the mammogram and ultrasound.  I personally evaluated this images.  She is scheduled for a partial mastectomy, also known as a lumpectomy, to remove the DCIS. She wants to proceed with the lumpectomy to remove the cancerous tissue. She has inquired about genetic testing for BRCA mutations due to concerns about her future risk of breast cancer, influenced by a friend with BRCA. She has not had children, which she mentioned in the context of discussing genetic testing. She is aware that BRCA-positive individuals often opt for bilateral mastectomies, but she wishes to proceed with the partial mastectomy first and pursue genetic testing for peace of mind regarding her future cancer risk.  She has concerns about the placement of surgical scars and the removal of lymph nodes, preferring not to have lymph nodes removed unless necessary. She has experienced a reaction to bandages used in previous procedures, resulting in scars, and has requested sterile strips for the upcoming procedure. She is scheduled for wire localization at 7:45 AM on the day of surgery, which she anticipates will be similar to the previous needle biopsy, although she found the biopsy traumatizing.  She is preparing for surgery with preoperative testing, including medication review and EKG. She is aware of the postoperative recovery process, including the use of pain  medication, ice packs, and the potential for fluid accumulation in the breast, which may require aspiration if uncomfortable.      PAST MEDICAL HISTORY:  Past Medical History:  Diagnosis Date   Breast cancer (CMS/HHS-HCC) 04/20/2023   Dyslipidemia    Other specified dyspareunia         PAST SURGICAL HISTORY:   Past Surgical History:  Procedure Laterality Date   COLONOSCOPY  01/31/2021   Normal/Rpt66yrs/TKT         MEDICATIONS:  Outpatient Encounter Medications as of 06/19/2023  Medication Sig Dispense Refill   acetaminophen  (TYLENOL ) 500 MG tablet Take 500 mg by mouth as needed     albuterol 90 mcg/actuation inhaler Inhale 2 inhalations into the lungs every 6 (six) hours as needed for Wheezing 1 each 1   COQ10, LIPOSOMAL UBIQUINOL, ORAL Take by mouth once daily     CYANOCOBALAMIN, VITAMIN B-12, ORAL Take by mouth With folic acid one daily     diphenhydrAMINE (BENADRYL) 25 mg tablet Take 25 mg by mouth as needed     ketoconazole (NIZORAL) 2 % shampoo Apply topically once a week 120 mL 8   multivitamin tablet Take 1 tablet by mouth once daily     rosuvastatin (CRESTOR) 10 MG tablet Take 1 tablet (10 mg total) by mouth once daily 90 tablet 3   TURMERIC ORAL Take by mouth once daily     venlafaxine (EFFEXOR) 25 MG tablet Take 1 tablet (25 mg total) by mouth once daily (Patient not taking: Reported on 06/13/2023) 30 tablet 11   No facility-administered encounter medications on file as of 06/19/2023.     ALLERGIES:   Epinephrine , Lactose, Penicillins,  Simvastatin, and Simvastatin   SOCIAL HISTORY:  Social History   Socioeconomic History   Marital status: Married  Occupational History   Occupation: Forensic psychologist  Tobacco Use   Smoking status: Every Day    Current packs/day: 0.00    Types: Cigarettes    Last attempt to quit: 02/24/2013    Years since quitting: 10.3    Passive exposure: Current   Smokeless tobacco: Never   Tobacco comments:    smokes "socially"  Vaping Use    Vaping status: Never Used  Substance and Sexual Activity   Alcohol use: Yes    Alcohol/week: 4.0 standard drinks of alcohol    Types: 4 Glasses of wine per week   Drug use: No   Sexual activity: Yes    Partners: Male    Birth control/protection: Post-menopausal  Social History Narrative   Feels safe in home.   Social Drivers of Corporate investment banker Strain: Low Risk  (05/17/2023)   Overall Financial Resource Strain (CARDIA)    Difficulty of Paying Living Expenses: Not hard at all  Food Insecurity: No Food Insecurity (05/17/2023)   Hunger Vital Sign    Worried About Running Out of Food in the Last Year: Never true    Ran Out of Food in the Last Year: Never true  Transportation Needs: No Transportation Needs (05/17/2023)   PRAPARE - Administrator, Civil Service (Medical): No    Lack of Transportation (Non-Medical): No    FAMILY HISTORY:  Family History  Problem Relation Name Age of Onset   High blood pressure (Hypertension) Mother     Hyperlipidemia (Elevated cholesterol) Mother     Thyroid disease Mother     Skin cancer Mother     High blood pressure (Hypertension) Father     Hyperlipidemia (Elevated cholesterol) Father     Myocardial Infarction (Heart attack) Father  46     GENERAL REVIEW OF SYSTEMS:   General ROS: negative for - chills, fatigue, fever, weight gain or weight loss Allergy and Immunology ROS: negative for - hives  Hematological and Lymphatic ROS: negative for - bleeding problems or bruising, negative for palpable nodes Endocrine ROS: negative for - heat or cold intolerance, hair changes Respiratory ROS: negative for - cough, shortness of breath or wheezing Cardiovascular ROS: no chest pain or palpitations GI ROS: negative for nausea, vomiting, abdominal pain, diarrhea, constipation Musculoskeletal ROS: negative for - joint swelling or muscle pain Neurological ROS: negative for - confusion, syncope Dermatological ROS: negative for  pruritus and rash  PHYSICAL EXAM:  Vitals:   06/19/23 0837  BP: (!) 165/104  Pulse: 67  .  Ht:152.4 cm (5') Wt:76.2 kg (167 lb 15.9 oz) WJX:BJYN surface area is 1.8 meters squared. Body mass index is 32.81 kg/m.Marisa Figueroa   GENERAL: Alert, active, oriented x3  HEENT: Pupils equal reactive to light. Extraocular movements are intact. Sclera clear. Palpebral conjunctiva normal red color.Pharynx clear.  NECK: Supple with no palpable mass and no adenopathy.  LUNGS: Sound clear with no rales rhonchi or wheezes.  HEART: Regular rhythm S1 and S2 without murmur.  BREAST: Both breast examined in the sitting and supine position.  No palpable masses, skin changes, nipple retraction or nipple discharge.  EXTREMITIES: Well-developed well-nourished symmetrical with no dependent edema.  NEUROLOGICAL: Awake alert oriented, facial expression symmetrical, moving all extremities.   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 MRI showed no additional lesions. She chose partial mastectomy over total mastectomy. There is a possibility of DCIS being upgraded to invasive carcinoma upon full pathology review, which would require lymph node evaluation. She is informed about the potential need for re-excision if margins are not clear or close for less than 2 mm. BRCA genetic testing was discussed to assess future cancer risk, but it will not delay the current surgical plan. She prefers to proceed with lumpectomy and pursue genetic testing postoperatively. Intraoperative x-ray and gross margin evaluation will be  performed to ensure complete excision. If margins are not clear, re-excision may be necessary. If invasive carcinoma is identified, lymph node evaluation will be required. She understands the surgical process, including wire localization and the potential need for additional surgery if margins are not clear. Proceed with partial mastectomy of the right breast. Lymph node removal will not be performed unless invasive carcinoma is identified post-surgery. Refer to a geneticist for BRCA and other genetic panel testing postoperatively. Schedule follow-up with oncologist and radiation oncologist post-surgery to discuss further treatment options.   Ductal carcinoma in situ (DCIS) of right breast [D05.11]          Patient and her husband verbalized understanding, all questions were answered, and were agreeable with the plan outlined above.   Eldred Grego, MD  Electronically signed by Eldred Grego, MD

## 2023-06-19 NOTE — H&P (View-Only) (Signed)
 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 A subsequent biopsy confirmed intermediate-grade DCIS. An MRI was performed to assess the extent of the disease, which did not reveal any additional findings beyond those seen on the mammogram and ultrasound.  I personally evaluated this images.  She is scheduled for a partial mastectomy, also known as a lumpectomy, to remove the DCIS. She wants to proceed with the lumpectomy to remove the cancerous tissue. She has inquired about genetic testing for BRCA mutations due to concerns about her future risk of breast cancer, influenced by a friend with BRCA. She has not had children, which she mentioned in the context of discussing genetic testing. She is aware that BRCA-positive individuals often opt for bilateral mastectomies, but she wishes to proceed with the partial mastectomy first and pursue genetic testing for peace of mind regarding her future cancer risk.  She has concerns about the placement of surgical scars and the removal of lymph nodes, preferring not to have lymph nodes removed unless necessary. She has experienced a reaction to bandages used in previous procedures, resulting in scars, and has requested sterile strips for the upcoming procedure. She is scheduled for wire localization at 7:45 AM on the day of surgery, which she anticipates will be similar to the previous needle biopsy, although she found the biopsy traumatizing.  She is preparing for surgery with preoperative testing, including medication review and EKG. She is aware of the postoperative recovery process, including the use of pain  medication, ice packs, and the potential for fluid accumulation in the breast, which may require aspiration if uncomfortable.      PAST MEDICAL HISTORY:  Past Medical History:  Diagnosis Date   Breast cancer (CMS/HHS-HCC) 04/20/2023   Dyslipidemia    Other specified dyspareunia         PAST SURGICAL HISTORY:   Past Surgical History:  Procedure Laterality Date   COLONOSCOPY  01/31/2021   Normal/Rpt66yrs/TKT         MEDICATIONS:  Outpatient Encounter Medications as of 06/19/2023  Medication Sig Dispense Refill   acetaminophen  (TYLENOL ) 500 MG tablet Take 500 mg by mouth as needed     albuterol 90 mcg/actuation inhaler Inhale 2 inhalations into the lungs every 6 (six) hours as needed for Wheezing 1 each 1   COQ10, LIPOSOMAL UBIQUINOL, ORAL Take by mouth once daily     CYANOCOBALAMIN, VITAMIN B-12, ORAL Take by mouth With folic acid one daily     diphenhydrAMINE (BENADRYL) 25 mg tablet Take 25 mg by mouth as needed     ketoconazole (NIZORAL) 2 % shampoo Apply topically once a week 120 mL 8   multivitamin tablet Take 1 tablet by mouth once daily     rosuvastatin (CRESTOR) 10 MG tablet Take 1 tablet (10 mg total) by mouth once daily 90 tablet 3   TURMERIC ORAL Take by mouth once daily     venlafaxine (EFFEXOR) 25 MG tablet Take 1 tablet (25 mg total) by mouth once daily (Patient not taking: Reported on 06/13/2023) 30 tablet 11   No facility-administered encounter medications on file as of 06/19/2023.     ALLERGIES:   Epinephrine , Lactose, Penicillins,  Simvastatin, and Simvastatin   SOCIAL HISTORY:  Social History   Socioeconomic History   Marital status: Married  Occupational History   Occupation: Forensic psychologist  Tobacco Use   Smoking status: Every Day    Current packs/day: 0.00    Types: Cigarettes    Last attempt to quit: 02/24/2013    Years since quitting: 10.3    Passive exposure: Current   Smokeless tobacco: Never   Tobacco comments:    smokes "socially"  Vaping Use    Vaping status: Never Used  Substance and Sexual Activity   Alcohol use: Yes    Alcohol/week: 4.0 standard drinks of alcohol    Types: 4 Glasses of wine per week   Drug use: No   Sexual activity: Yes    Partners: Male    Birth control/protection: Post-menopausal  Social History Narrative   Feels safe in home.   Social Drivers of Corporate investment banker Strain: Low Risk  (05/17/2023)   Overall Financial Resource Strain (CARDIA)    Difficulty of Paying Living Expenses: Not hard at all  Food Insecurity: No Food Insecurity (05/17/2023)   Hunger Vital Sign    Worried About Running Out of Food in the Last Year: Never true    Ran Out of Food in the Last Year: Never true  Transportation Needs: No Transportation Needs (05/17/2023)   PRAPARE - Administrator, Civil Service (Medical): No    Lack of Transportation (Non-Medical): No    FAMILY HISTORY:  Family History  Problem Relation Name Age of Onset   High blood pressure (Hypertension) Mother     Hyperlipidemia (Elevated cholesterol) Mother     Thyroid disease Mother     Skin cancer Mother     High blood pressure (Hypertension) Father     Hyperlipidemia (Elevated cholesterol) Father     Myocardial Infarction (Heart attack) Father  46     GENERAL REVIEW OF SYSTEMS:   General ROS: negative for - chills, fatigue, fever, weight gain or weight loss Allergy and Immunology ROS: negative for - hives  Hematological and Lymphatic ROS: negative for - bleeding problems or bruising, negative for palpable nodes Endocrine ROS: negative for - heat or cold intolerance, hair changes Respiratory ROS: negative for - cough, shortness of breath or wheezing Cardiovascular ROS: no chest pain or palpitations GI ROS: negative for nausea, vomiting, abdominal pain, diarrhea, constipation Musculoskeletal ROS: negative for - joint swelling or muscle pain Neurological ROS: negative for - confusion, syncope Dermatological ROS: negative for  pruritus and rash  PHYSICAL EXAM:  Vitals:   06/19/23 0837  BP: (!) 165/104  Pulse: 67  .  Ht:152.4 cm (5') Wt:76.2 kg (167 lb 15.9 oz) WJX:BJYN surface area is 1.8 meters squared. Body mass index is 32.81 kg/m.[name]   GENERAL: Alert, active, oriented x3  HEENT: Pupils equal reactive to light. Extraocular movements are intact. Sclera clear. Palpebral conjunctiva normal red color.Pharynx clear.  NECK: Supple with no palpable mass and no adenopathy.  LUNGS: Sound clear with no rales rhonchi or wheezes.  HEART: Regular rhythm S1 and S2 without murmur.  BREAST: Both breast examined in the sitting and supine position.  No palpable masses, skin changes, nipple retraction or nipple discharge.  EXTREMITIES: Well-developed well-nourished symmetrical with no dependent edema.  NEUROLOGICAL: Awake alert oriented, facial expression symmetrical, moving all extremities.   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 MRI showed no additional lesions. She chose partial mastectomy over total mastectomy. There is a possibility of DCIS being upgraded to invasive carcinoma upon full pathology review, which would require lymph node evaluation. She is informed about the potential need for re-excision if margins are not clear or close for less than 2 mm. BRCA genetic testing was discussed to assess future cancer risk, but it will not delay the current surgical plan. She prefers to proceed with lumpectomy and pursue genetic testing postoperatively. Intraoperative x-ray and gross margin evaluation will be  performed to ensure complete excision. If margins are not clear, re-excision may be necessary. If invasive carcinoma is identified, lymph node evaluation will be required. She understands the surgical process, including wire localization and the potential need for additional surgery if margins are not clear. Proceed with partial mastectomy of the right breast. Lymph node removal will not be performed unless invasive carcinoma is identified post-surgery. Refer to a geneticist for BRCA and other genetic panel testing postoperatively. Schedule follow-up with oncologist and radiation oncologist post-surgery to discuss further treatment options.   Ductal carcinoma in situ (DCIS) of right breast [D05.11]          Patient and her husband verbalized understanding, all questions were answered, and were agreeable with the plan outlined above.   Eldred Grego, MD  Electronically signed by Eldred Grego, MD

## 2023-06-20 ENCOUNTER — Encounter: Admission: RE | Payer: Self-pay | Source: Home / Self Care

## 2023-06-20 ENCOUNTER — Ambulatory Visit
Admission: RE | Admit: 2023-06-20 | Discharge: 2023-06-20 | Disposition: A | Discharge status: Home / Self Care | Source: Ambulatory Visit | Attending: General Surgery

## 2023-06-20 ENCOUNTER — Ambulatory Visit
Admission: RE | Admit: 2023-06-20 | Discharge: 2023-06-20 | Disposition: A | Discharge status: Home / Self Care | Source: Ambulatory Visit | Attending: General Surgery | Admitting: General Surgery

## 2023-06-20 ENCOUNTER — Encounter: Payer: Self-pay | Admitting: *Deleted

## 2023-06-20 ENCOUNTER — Other Ambulatory Visit: Payer: Self-pay | Admitting: General Surgery

## 2023-06-20 ENCOUNTER — Encounter: Payer: Self-pay | Admitting: Certified Registered"

## 2023-06-20 ENCOUNTER — Ambulatory Visit: Admission: RE | Admit: 2023-06-20 | Source: Home / Self Care | Admitting: General Surgery

## 2023-06-20 DIAGNOSIS — D0511 Intraductal carcinoma in situ of right breast: Secondary | ICD-10-CM

## 2023-06-20 HISTORY — PX: BREAST BIOPSY: SHX20

## 2023-06-20 SURGERY — BREAST LUMPECTOMY
Anesthesia: General | Site: Breast | Laterality: Right

## 2023-06-20 MED ORDER — MIDAZOLAM HCL 2 MG/2ML IJ SOLN
INTRAMUSCULAR | Status: AC
Start: 2023-06-20 — End: 2023-06-20
  Filled 2023-06-20: qty 2

## 2023-06-20 MED ORDER — LIDOCAINE HCL 1 % IJ SOLN
10.0000 mL | Freq: Once | INTRAMUSCULAR | Status: AC
  Administered 2023-06-20: 10 mL
  Filled 2023-06-20: qty 10

## 2023-06-20 MED ORDER — CEFAZOLIN SODIUM-DEXTROSE 2-4 GM/100ML-% IV SOLN
INTRAVENOUS | Status: AC
  Filled 2023-06-20: qty 100

## 2023-06-20 MED ORDER — FENTANYL CITRATE (PF) 100 MCG/2ML IJ SOLN
INTRAMUSCULAR | Status: AC
Start: 2023-06-20 — End: 2023-06-20
  Filled 2023-06-20: qty 2

## 2023-06-20 MED ORDER — PROPOFOL 10 MG/ML IV BOLUS
INTRAVENOUS | Status: AC
  Filled 2023-06-20: qty 20

## 2023-06-20 MED ORDER — LIDOCAINE HCL 1 % IJ SOLN
10.0000 mL | Freq: Once | INTRAMUSCULAR | Status: DC
  Filled 2023-06-20: qty 10

## 2023-06-20 MED ORDER — CHLORHEXIDINE GLUCONATE 0.12 % MT SOLN
OROMUCOSAL | Status: AC
  Filled 2023-06-20: qty 15

## 2023-06-21 ENCOUNTER — Encounter: Payer: Self-pay | Admitting: *Deleted

## 2023-06-21 NOTE — Progress Notes (Signed)
 Surgery was cancelled for yesterday due to needle loc wire not being long enough and needing to order longer wire.   Dr. Monda Angry nurse will notify me when surgery is rescheduled, then appointments with Dr. Adrian Alba and Dr. Jacalyn Martin will be rescheduled.

## 2023-06-25 ENCOUNTER — Encounter: Payer: Self-pay | Admitting: *Deleted

## 2023-06-25 NOTE — Progress Notes (Addendum)
 Lumpectomy has been rescheduled to 6/25.   She will see Dr. Adrian Alba and Dr. Jacalyn Martin on 7/9.  Appt. Details given to her.

## 2023-06-26 ENCOUNTER — Inpatient Hospital Stay: Attending: Oncology | Admitting: Licensed Clinical Social Worker

## 2023-06-26 ENCOUNTER — Encounter: Payer: Self-pay | Admitting: Licensed Clinical Social Worker

## 2023-06-26 DIAGNOSIS — D0511 Intraductal carcinoma in situ of right breast: Secondary | ICD-10-CM | POA: Insufficient documentation

## 2023-06-26 DIAGNOSIS — Z808 Family history of malignant neoplasm of other organs or systems: Secondary | ICD-10-CM | POA: Insufficient documentation

## 2023-06-26 DIAGNOSIS — Z8041 Family history of malignant neoplasm of ovary: Secondary | ICD-10-CM | POA: Diagnosis not present

## 2023-06-26 DIAGNOSIS — Z8042 Family history of malignant neoplasm of prostate: Secondary | ICD-10-CM | POA: Insufficient documentation

## 2023-06-26 DIAGNOSIS — Z170 Estrogen receptor positive status [ER+]: Secondary | ICD-10-CM | POA: Insufficient documentation

## 2023-06-26 NOTE — Progress Notes (Signed)
 REFERRING PROVIDER: Eldred Grego, MD [address] [place],  [place] [address]  PRIMARY PROVIDER:  Sari Cunning, MD  PRIMARY REASON FOR VISIT:  1. Ductal carcinoma in situ (DCIS) of right breast   2. Family history of prostate cancer   3. Family history of ovarian cancer   4. Family history of melanoma      HISTORY OF PRESENT ILLNESS:   Marisa Figueroa, a 53 y.o. female, was seen for a  cancer genetics consultation at the request of Dr. Dortha Gauss due to a personal and family history of cancer.  Marisa Figueroa presents to clinic today to discuss the possibility of a hereditary predisposition to cancer, genetic testing, and to further clarify her future cancer risks, as well as potential cancer risks for family members.   CANCER HISTORY:  In 2025, at the age of 38, Marisa Figueroa was diagnosed with DCIS of the right breast, ER/PR+. The treatment plan includes lumpectomy scheduled for 6/25, adjuvant radiation, and likely tamoxifen.   Oncology History  Ductal carcinoma in situ (DCIS) of right breast  05/11/2023 Initial Diagnosis   Ductal carcinoma in situ (DCIS) of right breast   05/23/2023 Cancer Staging   Staging form: Breast, AJCC 8th Edition - Clinical stage from 05/23/2023: Stage 0 (cTis (DCIS), cN0, cM0, G2, ER+, PR+, HER2-) - Signed by Shellie Dials, MD on 05/23/2023 Stage prefix: Initial diagnosis Histologic grading system: 3 grade system    RISK FACTORS:  Menarche was at age 27.  Ovaries intact: yes.  Hysterectomy: no.  Menopausal status: postmenopausal. Colonoscopy: yes; in 2023, 5 year recall. Mammogram within the last year: yes. Number of breast biopsies: 3   Past Medical History:  Diagnosis Date   Ductal carcinoma in situ (DCIS) of right breast    History of kidney stones    Hyperlipidemia     Past Surgical History:  Procedure Laterality Date   BREAST BIOPSY Right 04/19/2023   MM RT BREAST BX W LOC DEV 1ST LESION IMAGE BX SPEC STEREO GUIDE  04/19/2023 [facility]   BREAST BIOPSY Right 04/19/2023   MM RT BREAST BX W LOC DEV EA AD LESION IMG BX SPEC STEREO GUIDE 04/19/2023 [facility]   BREAST BIOPSY Right 06/20/2023   MM RT PLC BREAST LOC DEV   1ST LESION  INC MAMMO GUIDE 06/20/2023 [facility]   WISDOM TOOTH EXTRACTION      FAMILY HISTORY:  We obtained a detailed, 4-generation family history.  Significant diagnoses are listed below: Family History  Problem Relation Age of Onset   Melanoma Mother        metastatic   Prostate cancer Father    Ovarian cancer Paternal Aunt    ALS Paternal Grandfather    Marisa Figueroa has 1 brother, 25, and 1 niece, 4.   Marisa Figueroa mother had metastatic melanoma and passed at  40. No other known cancers on this side of the family, although there is a possibility her maternal grandmother had breast cancer.  Marisa Figueroa father had prostate cancer and skin cancer in his 15s, he is living at 68. Patient's paternal aunt had ovarian cancer and passed at 54, she had exposures in the [employer].   Marisa Figueroa is unaware of previous family history of genetic testing for hereditary cancer risks. There is no reported Ashkenazi Jewish ancestry. There is no known consanguinity.    GENETIC COUNSELING ASSESSMENT: Marisa Figueroa is a 53 y.o. female with a personal and family history of cancer which is somewhat suggestive of  a hereditary cancer syndrome and predisposition to cancer. We, therefore, discussed and recommended the following at today's visit.   DISCUSSION: We discussed that approximately 10% of breast cancer is hereditary. Most cases of hereditary breast cancer are associated with BRCA1/2 genes, which also increase risk for prostate cancer and ovarian cancer, although there are other genes associated with hereditary cancer as well. Cancers and risks are gene specific. We discussed that testing is beneficial for several reasons including knowing about cancer risks, identifying potential screening and  risk-reduction options that may be appropriate, and to understand if other family members could be at risk for cancer and allow them to undergo genetic testing.   We reviewed the characteristics, features and inheritance patterns of hereditary cancer syndromes. We also discussed genetic testing, including the appropriate family members to test, the process of testing, insurance coverage and turn-around-time for results. We discussed the implications of a negative, positive and/or variant of uncertain significant result. We recommended Marisa Figueroa pursue genetic testing for the [facility]+RNA gene panel.   Based on Marisa Figueroa's personal and family history of cancer, she meets medical criteria for genetic testing. Despite that she meets criteria, she may still have an out of pocket cost.   PLAN: After considering the risks, benefits, and limitations, Marisa Figueroa did not wish to pursue genetic testing at today's visit. She would like to think about it and will let us  know if she wants testing in the future. We understand this decision and remain available to coordinate genetic testing at any time in the future. We, therefore, recommend Marisa Figueroa continue to follow the cancer screening guidelines given by her primary healthcare provider.  Marisa Figueroa questions were answered to her satisfaction today. Our contact information was provided should additional questions or concerns arise. Thank you for the referral and allowing us  to share in the care of your patient.   Marisa Gee, MS, [facility] Genetic Counselor [email or web address] Phone: [phone]  35 minutes were spent on the date of the encounter in service to the patient including preparation, face-to-face consultation, documentation and care coordination. Patient's husband was also present. Dr. Nelson Bandy was available for discussion regarding this case.   _______________________________________________________________________ For Office  Staff:  Number of people involved in session: 2 Was an Intern/ student involved with case: no

## 2023-07-03 NOTE — Anesthesia Preprocedure Evaluation (Addendum)
 Anesthesia Evaluation  Patient identified by MRN, date of birth, ID band Patient awake    Reviewed: Allergy & Precautions, NPO status , Patient's Chart, lab work & pertinent test results  History of Anesthesia Complications Negative for: history of anesthetic complications  Airway Mallampati: III  TM Distance: >3 FB Neck ROM: full    Dental  (+) Chipped   Pulmonary COPD, Current Smoker   Pulmonary exam normal        Cardiovascular negative cardio ROS Normal cardiovascular exam     Neuro/Psych negative neurological ROS  negative psych ROS   GI/Hepatic Neg liver ROS,GERD  Controlled,,  Endo/Other  negative endocrine ROS    Renal/GU Renal disease     Musculoskeletal   Abdominal   Peds  Hematology negative hematology ROS (+)   Anesthesia Other Findings Past Medical History: No date: Ductal carcinoma in situ (DCIS) of right breast No date: History of kidney stones No date: Hyperlipidemia  Past Surgical History: 04/19/2023: BREAST BIOPSY; Right     Comment:  MM RT BREAST BX W LOC DEV 1ST LESION IMAGE BX SPEC               STEREO GUIDE 04/19/2023 [facility] 04/19/2023: BREAST BIOPSY; Right     Comment:  MM RT BREAST BX W LOC DEV EA AD LESION IMG BX SPEC               STEREO GUIDE 04/19/2023 [facility] 06/20/2023: BREAST BIOPSY; Right     Comment:  MM RT PLC BREAST LOC DEV   1ST LESION  INC MAMMO GUIDE               06/20/2023 [facility] No date: WISDOM TOOTH EXTRACTION     Reproductive/Obstetrics negative OB ROS                              Anesthesia Physical Anesthesia Plan  ASA: 2  Anesthesia Plan: General LMA   Post-op Pain Management:    Induction: Intravenous  PONV Risk Score and Plan: Dexamethasone, Ondansetron , Midazolam  and Treatment may vary due to age or medical condition  Airway Management Planned: LMA  Additional Equipment:   Intra-op Plan:    Post-operative Plan: Extubation in OR  Informed Consent: I have reviewed the patients History and Physical, chart, labs and discussed the procedure including the risks, benefits and alternatives for the proposed anesthesia with the patient or authorized representative who has indicated his/her understanding and acceptance.     Dental Advisory Given  Plan Discussed with: Anesthesiologist, CRNA and Surgeon  Anesthesia Plan Comments: (Patient consented for risks of anesthesia including but not limited to:  - adverse reactions to medications - damage to eyes, teeth, lips or other oral mucosa - nerve damage due to positioning  - sore throat or hoarseness - Damage to heart, brain, nerves, lungs, other parts of body or loss of life  Patient voiced understanding and assent.)        Anesthesia Quick Evaluation

## 2023-07-04 ENCOUNTER — Institutional Professional Consult (permissible substitution): Admitting: Radiation Oncology

## 2023-07-04 ENCOUNTER — Ambulatory Visit: Admitting: Anesthesiology

## 2023-07-04 ENCOUNTER — Ambulatory Visit
Admission: RE | Admit: 2023-07-04 | Discharge: 2023-07-04 | Disposition: A | Discharge status: Home / Self Care | Source: Ambulatory Visit | Attending: General Surgery | Admitting: General Surgery

## 2023-07-04 ENCOUNTER — Encounter

## 2023-07-04 ENCOUNTER — Encounter: Payer: Self-pay | Admitting: General Surgery

## 2023-07-04 ENCOUNTER — Ambulatory Visit: Admitting: Oncology

## 2023-07-04 ENCOUNTER — Ambulatory Visit
Admission: RE | Admit: 2023-07-04 | Discharge: 2023-07-04 | Disposition: A | Discharge status: Home / Self Care | Source: Ambulatory Visit | Attending: General Surgery

## 2023-07-04 ENCOUNTER — Other Ambulatory Visit: Payer: Self-pay

## 2023-07-04 ENCOUNTER — Ambulatory Visit
Admission: RE | Admit: 2023-07-04 | Discharge: 2023-07-04 | Disposition: A | Discharge status: Home / Self Care | Attending: General Surgery | Admitting: General Surgery

## 2023-07-04 ENCOUNTER — Encounter
Admission: RE | Disposition: A | Discharge status: Home / Self Care | Payer: Self-pay | Source: Home / Self Care | Attending: General Surgery

## 2023-07-04 DIAGNOSIS — N6081 Other benign mammary dysplasias of right breast: Secondary | ICD-10-CM | POA: Insufficient documentation

## 2023-07-04 DIAGNOSIS — F17210 Nicotine dependence, cigarettes, uncomplicated: Secondary | ICD-10-CM | POA: Insufficient documentation

## 2023-07-04 DIAGNOSIS — Z1501 Genetic susceptibility to malignant neoplasm of breast: Secondary | ICD-10-CM | POA: Insufficient documentation

## 2023-07-04 DIAGNOSIS — D0511 Intraductal carcinoma in situ of right breast: Secondary | ICD-10-CM

## 2023-07-04 DIAGNOSIS — K219 Gastro-esophageal reflux disease without esophagitis: Secondary | ICD-10-CM | POA: Insufficient documentation

## 2023-07-04 DIAGNOSIS — J449 Chronic obstructive pulmonary disease, unspecified: Secondary | ICD-10-CM | POA: Diagnosis not present

## 2023-07-04 HISTORY — PX: BREAST LUMPECTOMY: SHX2

## 2023-07-04 HISTORY — PX: BREAST BIOPSY: SHX20

## 2023-07-04 SURGERY — BREAST LUMPECTOMY
Anesthesia: General | Site: Breast | Laterality: Right

## 2023-07-04 MED ORDER — MIDAZOLAM HCL 5 MG/5ML IJ SOLN
INTRAMUSCULAR | Status: DC | PRN
  Administered 2023-07-04: 2 mg via INTRAVENOUS

## 2023-07-04 MED ORDER — FENTANYL CITRATE (PF) 100 MCG/2ML IJ SOLN
INTRAMUSCULAR | Status: DC | PRN
  Administered 2023-07-04: 50 ug via INTRAVENOUS
  Administered 2023-07-04 (×2): 25 ug via INTRAVENOUS

## 2023-07-04 MED ORDER — PROPOFOL 10 MG/ML IV BOLUS
INTRAVENOUS | Status: AC
  Filled 2023-07-04: qty 20

## 2023-07-04 MED ORDER — BUPIVACAINE-EPINEPHRINE (PF) 0.5% -1:200000 IJ SOLN
INTRAMUSCULAR | Status: AC
  Filled 2023-07-04: qty 30

## 2023-07-04 MED ORDER — BUPIVACAINE-EPINEPHRINE 0.5% -1:200000 IJ SOLN
INTRAMUSCULAR | Status: DC | PRN
  Administered 2023-07-04: 30 mL

## 2023-07-04 MED ORDER — ONDANSETRON HCL 4 MG/2ML IJ SOLN
INTRAMUSCULAR | Status: DC | PRN
  Administered 2023-07-04: 4 mg via INTRAVENOUS

## 2023-07-04 MED ORDER — EPHEDRINE SULFATE-NACL 50-0.9 MG/10ML-% IV SOSY
PREFILLED_SYRINGE | INTRAVENOUS | Status: DC | PRN
  Administered 2023-07-04: 10 mg via INTRAVENOUS

## 2023-07-04 MED ORDER — CEFAZOLIN SODIUM-DEXTROSE 2-4 GM/100ML-% IV SOLN
INTRAVENOUS | Status: AC
  Filled 2023-07-04: qty 100

## 2023-07-04 MED ORDER — DEXMEDETOMIDINE HCL IN NACL 80 MCG/20ML IV SOLN
INTRAVENOUS | Status: DC | PRN
  Administered 2023-07-04: 8 ug via INTRAVENOUS
  Administered 2023-07-04: 4 ug via INTRAVENOUS

## 2023-07-04 MED ORDER — FENTANYL CITRATE (PF) 100 MCG/2ML IJ SOLN
25.0000 ug | INTRAMUSCULAR | Status: DC | PRN
  Administered 2023-07-04: 25 ug via INTRAVENOUS
  Administered 2023-07-04: 50 ug via INTRAVENOUS
  Administered 2023-07-04: 25 ug via INTRAVENOUS

## 2023-07-04 MED ORDER — LACTATED RINGERS IV SOLN: INTRAVENOUS | Status: DC

## 2023-07-04 MED ORDER — FENTANYL CITRATE (PF) 100 MCG/2ML IJ SOLN
INTRAMUSCULAR | Status: AC
  Filled 2023-07-04: qty 2

## 2023-07-04 MED ORDER — OXYCODONE HCL 5 MG/5ML PO SOLN: 5.0000 mg | Freq: Once | ORAL | Status: AC | PRN

## 2023-07-04 MED ORDER — ACETAMINOPHEN 10 MG/ML IV SOLN
INTRAVENOUS | Status: DC | PRN
Start: 2023-07-04 — End: 2023-07-04
  Administered 2023-07-04: 1000 mg via INTRAVENOUS

## 2023-07-04 MED ORDER — HEMOSTATIC AGENTS (NO CHARGE) OPTIME
TOPICAL | Status: DC | PRN
  Administered 2023-07-04: 1 via TOPICAL

## 2023-07-04 MED ORDER — LIDOCAINE HCL (PF) 1 % IJ SOLN
5.0000 mL | Freq: Once | INTRAMUSCULAR | Status: AC
  Administered 2023-07-04: 5 mL
  Filled 2023-07-04: qty 5

## 2023-07-04 MED ORDER — DEXAMETHASONE SODIUM PHOSPHATE 10 MG/ML IJ SOLN
INTRAMUSCULAR | Status: DC | PRN
  Administered 2023-07-04: 10 mg via INTRAVENOUS

## 2023-07-04 MED ORDER — STERILE WATER FOR IRRIGATION IR SOLN
Status: DC | PRN
  Administered 2023-07-04: 1000 mL

## 2023-07-04 MED ORDER — PHENYLEPHRINE 80 MCG/ML (10ML) SYRINGE FOR IV PUSH (FOR BLOOD PRESSURE SUPPORT)
PREFILLED_SYRINGE | INTRAVENOUS | Status: DC | PRN
  Administered 2023-07-04 (×2): 80 ug via INTRAVENOUS

## 2023-07-04 MED ORDER — OXYCODONE HCL 5 MG PO TABS
5.0000 mg | ORAL_TABLET | Freq: Once | ORAL | Status: AC | PRN
  Administered 2023-07-04: 5 mg via ORAL

## 2023-07-04 MED ORDER — OXYCODONE-ACETAMINOPHEN 5-325 MG PO TABS: 1.0000 | ORAL_TABLET | ORAL | 0 refills | Status: DC | PRN

## 2023-07-04 MED ORDER — OXYCODONE HCL 5 MG PO TABS
ORAL_TABLET | ORAL | Status: AC
  Filled 2023-07-04: qty 1

## 2023-07-04 MED ORDER — LIDOCAINE HCL (CARDIAC) PF 100 MG/5ML IV SOSY
PREFILLED_SYRINGE | INTRAVENOUS | Status: DC | PRN
  Administered 2023-07-04: 70 mg via INTRAVENOUS

## 2023-07-04 MED ORDER — CHLORHEXIDINE GLUCONATE 0.12 % MT SOLN
OROMUCOSAL | Status: AC
  Filled 2023-07-04: qty 15

## 2023-07-04 MED ORDER — PROPOFOL 10 MG/ML IV BOLUS
INTRAVENOUS | Status: DC | PRN
  Administered 2023-07-04: 160 mg via INTRAVENOUS

## 2023-07-04 MED ORDER — ORAL CARE MOUTH RINSE: 15.0000 mL | Freq: Once | OROMUCOSAL | Status: DC

## 2023-07-04 MED ORDER — MIDAZOLAM HCL 2 MG/2ML IJ SOLN
INTRAMUSCULAR | Status: AC
Start: 2023-07-04 — End: 2023-07-04
  Filled 2023-07-04: qty 2

## 2023-07-04 MED ORDER — CEFAZOLIN SODIUM-DEXTROSE 2-4 GM/100ML-% IV SOLN
2.0000 g | Freq: Once | INTRAVENOUS | Status: AC
  Administered 2023-07-04: 2 g via INTRAVENOUS

## 2023-07-04 MED ORDER — CHLORHEXIDINE GLUCONATE 0.12 % MT SOLN: 15.0000 mL | Freq: Once | OROMUCOSAL | Status: DC

## 2023-07-04 SURGICAL SUPPLY — 32 items
BLADE SURG 15 STRL LF DISP TIS (BLADE) ×1 IMPLANT
CHLORAPREP W/TINT 26 (MISCELLANEOUS) IMPLANT
DERMABOND ADVANCED .7 DNX12 (GAUZE/BANDAGES/DRESSINGS) ×1 IMPLANT
DEVICE DUBIN SPECIMEN MAMMOGRA (MISCELLANEOUS) ×1 IMPLANT
DRAPE LAPAROTOMY 77X122 PED (DRAPES) ×1 IMPLANT
ELECTRODE BLDE 4.0 EZ CLN MEGD (MISCELLANEOUS) IMPLANT
ELECTRODE REM PT RTRN 9FT ADLT (ELECTROSURGICAL) ×1 IMPLANT
GAUZE 4X4 16PLY ~~LOC~~+RFID DBL (SPONGE) IMPLANT
GLOVE BIO SURGEON STRL SZ 6.5 (GLOVE) ×1 IMPLANT
GLOVE BIOGEL PI IND STRL 6.5 (GLOVE) ×1 IMPLANT
GLOVE SURG SYN 6.5 ES PF (GLOVE) ×2 IMPLANT
GLOVE SURG SYN 6.5 PF PI (GLOVE) ×2 IMPLANT
GOWN STRL REUS W/ TWL LRG LVL3 (GOWN DISPOSABLE) ×3 IMPLANT
KIT MARKER MARGIN INK (KITS) IMPLANT
KIT TURNOVER KIT A (KITS) ×1 IMPLANT
LABEL OR SOLS (LABEL) ×1 IMPLANT
MANIFOLD NEPTUNE II (INSTRUMENTS) ×1 IMPLANT
MARKER MARGIN CORRECT CLIP (MARKER) IMPLANT
NDL HYPO 25X1 1.5 SAFETY (NEEDLE) ×1 IMPLANT
NEEDLE HYPO 25X1 1.5 SAFETY (NEEDLE) ×1 IMPLANT
PACK BASIN MINOR ARMC (MISCELLANEOUS) ×1 IMPLANT
POWDER SURGICEL 3.0 GRAM (HEMOSTASIS) IMPLANT
RETRACTOR RING XSMALL (MISCELLANEOUS) IMPLANT
SHEATH BREAST BIOPSY SKIN MKR (SHEATH) IMPLANT
SUT SILK 2 0 SH (SUTURE) IMPLANT
SUT VIC AB 2-0 SH 27XBRD (SUTURE) IMPLANT
SUT VIC AB 3-0 SH 27X BRD (SUTURE) ×1 IMPLANT
SUTURE MNCRL 4-0 27XMF (SUTURE) ×1 IMPLANT
SYR 10ML LL (SYRINGE) ×1 IMPLANT
TRAP FLUID SMOKE EVACUATOR (MISCELLANEOUS) ×1 IMPLANT
WATER STERILE IRR 1000ML POUR (IV SOLUTION) ×1 IMPLANT
WATER STERILE IRR 500ML POUR (IV SOLUTION) ×1 IMPLANT

## 2023-07-04 NOTE — Anesthesia Postprocedure Evaluation (Signed)
 Anesthesia Post Note  Patient: Marisa Figueroa  Procedure(s) Performed: BREAST LUMPECTOMY (Right: Breast)  Patient location during evaluation: PACU Anesthesia Type: General Level of consciousness: awake and alert Pain management: pain level controlled Vital Signs Assessment: post-procedure vital signs reviewed and stable Respiratory status: spontaneous breathing, nonlabored ventilation and respiratory function stable Cardiovascular status: blood pressure returned to baseline and stable Postop Assessment: no apparent nausea or vomiting Anesthetic complications: no   No notable events documented.   Last Vitals:  Vitals:   07/04/23 1545 07/04/23 1553  BP: 119/75 128/85  Pulse: 66 67  Resp: (!) 9 16  Temp:  36.6 C  SpO2: 98% 98%    Last Pain:  Vitals:   07/04/23 1553  TempSrc: Temporal  PainSc: 7                  Fairy POUR Edmond Ginsberg

## 2023-07-04 NOTE — Op Note (Signed)
 Preoperative diagnosis: Right breast carcinoma.  Postoperative diagnosis: Same.   Procedure: Right needle-localized partial mastectomy.                       Anesthesia: GETA  Surgeon: Dr. Cesar Coe  Wound Classification: Clean  Indications: Patient is a 53 y.o. female with a nonpalpable right breast mass noted on mammography with core biopsy demonstrating ductal carcinoma in situ requires bracketed needle-localized partial mastectomy for treatment with sentinel lymph node biopsy.   Findings: 1. Specimen mammography shows marker and tag on specimen 2. Pathology call refers gross examination of margins was unable to identify a mass to do gross evaluation of margins. Both biopsy sides were in specimen.  3. No other palpable mass or lymph node identified.   Description of procedure: Preoperative needle localization was performed by radiology.  Localization studies were reviewed. The patient was taken to the operating room and placed supine on the operating table, and after general anesthesia the right chest and axilla were prepped and draped in the usual sterile fashion. A time-out was completed verifying correct patient, procedure, site, positioning, and implant(s) and/or special equipment prior to beginning this procedure.  By comparing the localization studies, the probable trajectory and location of the mass was visualized. A circumareolar skin incision was planned in such a way as to minimize the amount of dissection to reach the mass.  The skin incision was made. Flaps were raised and the location of the needles were confirmed A 2-0 silk figure-of-eight stay suture was placed and used for retraction. Dissection was then taken down circumferentially, taking care to include the entire localizing needles and a wide margin of grossly normal tissue. The specimen and entire localizing needles were removed. The specimen was oriented and sent to radiology with the localization studies. Pathology  reported unable to give gross evaluation of margins since there is no mass identified.  The wound was irrigated. Hemostasis was checked.   The breast wound was then approached for closure.  Eliminate dead space a tissue transfer technique was utilized.  The breast and pectoralis fascia was elevated off the underlying muscle and the serratus muscle circumferentially for a distance of about 7 centimeters (49 sq cm.  The fascial layer was then approximated with interrupted 2-0 Vicryl sutures.  The superficial layer of the breast parenchyma was then approximated in a similar fashion.  This was done in a radial direction.  The skin flaps were then elevated circumferentially to remove a ripple noted superiorly and medially. The skin was closed with 4-0 Monocryl. Dermabond was applied.   Specimen: Right Breast mass                     Complications: None  Estimated Blood Loss: 10 mL

## 2023-07-04 NOTE — Transfer of Care (Signed)
 Immediate Anesthesia Transfer of Care Note  Patient: Marisa Figueroa  Procedure(s) Performed: BREAST LUMPECTOMY (Right: Breast)  Patient Location: PACU  Anesthesia Type:General  Level of Consciousness: drowsy  Airway & Oxygen Therapy: Patient Spontanous Breathing  Post-op Assessment: Report given to RN and Post -op Vital signs reviewed and stable  Post vital signs: Reviewed and stable  Last Vitals:  Vitals Value Taken Time  BP 127/69 07/04/23 14:47  Temp    Pulse 70 07/04/23 14:50  Resp 0 07/04/23 14:50  SpO2 95 % 07/04/23 14:50  Vitals shown include unfiled device data.  Last Pain:  Vitals:   07/04/23 1117  TempSrc: Temporal  PainSc: 0-No pain         Complications: No notable events documented.

## 2023-07-04 NOTE — Interval H&P Note (Signed)
 History and Physical Interval Note:  07/04/2023 12:22 PM  Marisa Figueroa  has presented today for surgery, with the diagnosis of D05..11 DCIS , rt breast.  The various methods of treatment have been discussed with the patient and family. After consideration of risks, benefits and other options for treatment, the patient has consented to  Procedure(s) with comments: BREAST LUMPECTOMY (Right) - with needle localization as a surgical intervention.  The patient's history has been reviewed, patient examined, no change in status, stable for surgery.  I have reviewed the patient's chart and labs.  Questions were answered to the patient's satisfaction.     Lucas Sjogren

## 2023-07-04 NOTE — Anesthesia Procedure Notes (Signed)
 Procedure Name: LMA Insertion Date/Time: 07/04/2023 12:37 PM  Performed by: Germaine Maeola CROME, CRNAPre-anesthesia Checklist: Patient identified, Emergency Drugs available, Suction available and Patient being monitored Patient Re-evaluated:Patient Re-evaluated prior to induction Oxygen Delivery Method: Circle system utilized Preoxygenation: Pre-oxygenation with 100% oxygen Induction Type: IV induction Ventilation: Mask ventilation without difficulty LMA: LMA inserted LMA Size: 4.0 Tube type: Oral Number of attempts: 1 Placement Confirmation: positive ETCO2 and breath sounds checked- equal and bilateral Tube secured with: Tape Dental Injury: Teeth and Oropharynx as per pre-operative assessment

## 2023-07-04 NOTE — Discharge Instructions (Signed)

## 2023-07-05 ENCOUNTER — Encounter: Payer: Self-pay | Admitting: General Surgery

## 2023-07-06 ENCOUNTER — Ambulatory Visit
Admission: RE | Admit: 2023-07-06 | Discharge: 2023-07-06 | Disposition: A | Discharge status: Home / Self Care | Source: Ambulatory Visit | Attending: General Surgery | Admitting: General Surgery

## 2023-07-06 ENCOUNTER — Other Ambulatory Visit: Payer: Self-pay | Admitting: General Surgery

## 2023-07-06 DIAGNOSIS — M795 Residual foreign body in soft tissue: Secondary | ICD-10-CM

## 2023-07-09 ENCOUNTER — Other Ambulatory Visit: Payer: Self-pay | Admitting: Pathology

## 2023-07-09 LAB — SURGICAL PATHOLOGY

## 2023-07-11 ENCOUNTER — Inpatient Hospital Stay: Payer: Self-pay

## 2023-07-17 NOTE — Progress Notes (Signed)
   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  She underwent a right breast partial mastectomy on July 04, 2023, for intermediate grade ductal carcinoma in situ, grade two. The final pathology showed ductal carcinoma in situ, intermediate grade, grade two, with an estimated size of at least 60 millimeters and the closest margin being one millimeter anterior and superior.  Post-operatively, she has experienced fluid accumulation at the surgical site, which began slowly last Wednesday and worsened by Saturday, becoming more painful and red. She describes significant fatigue and discomfort due to the pressure and stretching of the skin, affecting her ability to move her arm comfortably.  She notes a rash-like area on the breast, which she suspects might be due to heat or irritation from her bra, especially given the hot weather. She is concerned about the redness and the possibility of infection, although she is unsure if it is a heat rash or something else.  No fever is reported, but she has significant fatigue, stating she has been 'dragging ass for three days' and is eager for relief from the symptoms.   PAST SURGICAL HISTORY:   Past Surgical History:  Procedure Laterality Date  . COLONOSCOPY  01/31/2021   Normal/Rpt87yrs/TKT  . MASTECTOMY PARTIAL Right 07/04/2023   Dr Lucas Catchings -- w/ NL (brackett)         PHYSICAL EXAM:  Vitals:   07/17/23 0817  BP: (!) 144/91  Pulse: 66  .  Ht:149.9 cm (4' 11) Wt:75.8 kg (167 lb) ADJ:Anib surface area is 1.78 meters squared. Body mass index is 33.73 kg/m.SABRA   GENERAL: Alert, active, oriented x3  BREAST: Right breast incision with blanching erythema.  Palpable fluid collection.  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 Final pathology indicated grade two DCIS with a size of at least 60 mm. The closest margin is 1 mm at the anterior and superior margins.  Postoperative seroma and infection   She developed a postoperative seroma and possible infection after the partial mastectomy for DCIS. Fluid accumulation has been present since Wednesday, with increased pain, redness, and swelling by Saturday. Although heat rash or irritation from a bra is considered, infection is suspected due to redness and pain. Aspirate the seroma to relieve pressure and discomfort. Administer antibiotics to address potential infection.  260 cc of mixture of seroma and hematoma was aspirated.  We had a long discussion about the 1 mm margin.  Due to the final pathology showing DCIS, intermediate grade and the size of the seroma I think that it is reasonable to proceed with radiation therapy without the reexcision of the margin.  At this time the benefit of reexcision does not outweigh the risk.  Will discuss this between the medical oncologist and radiation oncologist.  Ductal carcinoma in situ (DCIS) of right breast [D05.11]         Patient and her husband verbalized understanding, all questions were answered, and were agreeable with the plan outlined above.   Lucas Sjogren, MD

## 2023-07-18 ENCOUNTER — Encounter: Payer: Self-pay | Admitting: Oncology

## 2023-07-18 ENCOUNTER — Ambulatory Visit: Payer: Self-pay | Admitting: Radiation Oncology

## 2023-07-18 ENCOUNTER — Inpatient Hospital Stay: Payer: Self-pay | Attending: Oncology | Admitting: Oncology

## 2023-07-18 DIAGNOSIS — F17210 Nicotine dependence, cigarettes, uncomplicated: Secondary | ICD-10-CM | POA: Insufficient documentation

## 2023-07-18 DIAGNOSIS — D0511 Intraductal carcinoma in situ of right breast: Secondary | ICD-10-CM | POA: Insufficient documentation

## 2023-07-18 DIAGNOSIS — Z170 Estrogen receptor positive status [ER+]: Secondary | ICD-10-CM | POA: Insufficient documentation

## 2023-07-23 ENCOUNTER — Inpatient Hospital Stay: Payer: Self-pay

## 2023-07-23 NOTE — Progress Notes (Signed)
 Tumor Board Documentation  Aizlyn Schifano was presented by Dr. Jacobo at our Tumor Board on 07/23/2023, which included representatives from medical oncology, radiation oncology, radiology, pathology, navigation, research.  Arlet currently presents as a current patient with history of the following treatments: surgical intervention(s).  Additionally, we reviewed previous medical and familial history, history of present illness, and recent lab results along with all available histopathologic and imaging studies. The tumor board considered available treatment options and made the following recommendations: Adjuvant radiation, Hormonal therapy    The following procedures/referrals were also placed: No orders of the defined types were placed in this encounter.   Clinical Trial Status: not discussed   Staging used: AJCC Staging: T: Tis         National site-specific guidelines   were discussed with respect to the case.  Tumor board is a meeting of clinicians from various specialty areas who evaluate and discuss patients for whom a multidisciplinary approach is being considered. Final determinations in the plan of care are those of the provider(s). The responsibility for follow up of recommendations given during tumor board is that of the provider.   Today's extended care, comprehensive team conference, Recia was not present for the discussion and was not examined.   Multidisciplinary Tumor Board is a multidisciplinary case peer review process.  Decisions discussed in the Multidisciplinary Tumor Board reflect the opinions of the specialists present at the conference without having examined the patient.  Ultimately, treatment and diagnostic decisions rest with the primary provider(s) and the patient.

## 2023-07-24 ENCOUNTER — Encounter: Payer: Self-pay | Admitting: *Deleted

## 2023-07-24 NOTE — Progress Notes (Signed)
 Appointments with Dr. Jacobo and Dr. Lenn have been rescheduled for 7/17.

## 2023-07-25 ENCOUNTER — Inpatient Hospital Stay (HOSPITAL_BASED_OUTPATIENT_CLINIC_OR_DEPARTMENT_OTHER): Payer: Self-pay | Admitting: Hospice and Palliative Medicine

## 2023-07-25 DIAGNOSIS — D0511 Intraductal carcinoma in situ of right breast: Secondary | ICD-10-CM

## 2023-07-25 NOTE — Progress Notes (Signed)
 Multidisciplinary Oncology Council Documentation  Marisa Figueroa was presented by our [facility] on 07/25/2023, which included representatives from:  Palliative Care Dietitian  Physical/Occupational Therapist Nurse Navigator Genetics Social work Survivorship RN Financial Navigator Research RN   [name] currently presents with history of breast cancer  We reviewed previous medical and familial history, history of present illness, and recent lab results along with all available histopathologic and imaging studies. The MOC considered available treatment options and made the following recommendations/referrals:  Genetics, SW  The [facility] is a meeting of clinicians from various specialty areas who evaluate and discuss patients for whom a multidisciplinary approach is being considered. Final determinations in the plan of care are those of the provider(s).   Today's extended care, comprehensive team conference, Marisa Figueroa was not present for the discussion and was not examined.

## 2023-07-26 ENCOUNTER — Encounter: Payer: Self-pay | Admitting: Oncology

## 2023-07-26 ENCOUNTER — Ambulatory Visit
Admission: RE | Admit: 2023-07-26 | Discharge: 2023-07-26 | Disposition: A | Discharge status: Home / Self Care | Source: Ambulatory Visit | Attending: Radiation Oncology | Admitting: Radiation Oncology

## 2023-07-26 ENCOUNTER — Ambulatory Visit (HOSPITAL_BASED_OUTPATIENT_CLINIC_OR_DEPARTMENT_OTHER): Admitting: Oncology

## 2023-07-26 ENCOUNTER — Encounter: Payer: Self-pay | Admitting: Radiation Oncology

## 2023-07-26 VITALS — BP 133/102 | HR 59 | Temp 97.7°F | Resp 18 | Ht 60.0 in | Wt 164.0 lb

## 2023-07-26 DIAGNOSIS — F17210 Nicotine dependence, cigarettes, uncomplicated: Secondary | ICD-10-CM | POA: Diagnosis not present

## 2023-07-26 DIAGNOSIS — D0511 Intraductal carcinoma in situ of right breast: Secondary | ICD-10-CM | POA: Diagnosis present

## 2023-07-26 DIAGNOSIS — Z170 Estrogen receptor positive status [ER+]: Secondary | ICD-10-CM | POA: Diagnosis not present

## 2023-07-26 NOTE — Consult Note (Signed)
 NEW PATIENT EVALUATION  Name: Marisa Figueroa  MRN: [id number]  Date:   07/26/2023     DOB: 29-Oct-1970   This 53 y.o. female patient presents to the clinic for initial evaluation of stage 0 (Tis N0 M0) ER positive ductal carcinoma in situ of the right breast status post wide local excision.  REFERRING PHYSICIAN: Cleotilde Oneil FALCON, MD  CHIEF COMPLAINT:  Chief Complaint  Patient presents with   DCIS    DIAGNOSIS: The encounter diagnosis was Ductal carcinoma in situ (DCIS) of right breast.   PREVIOUS INVESTIGATIONS:  Mammogram and ultrasound reviewed Clinical notes reviewed Pathology reports reviewed  HPI: Patient is a 53 year old female who presented with an abnormal mammogram.  There were 2 adjacent groups of calcifications in the upper outer right breast posterior depth low suspicious for malignancy although stereotactic guided biopsy was performed.  She underwent targeted biopsy which showed ductal carcinoma in situ ER positive.  MRI of the breast showed 3.5 cm of DCIS within the upper outer quadrant no other suspicious abnormalities of either breast.  No suspicious appearing lymph nodes were noted.  She went on to have a wide local excision for at least 60 mm of grade 2 ductal carcinoma in situ.  Margins were clear but close at 1 mm specifically the anterior and superior margin.  No lymph nodes were submitted.  Tumor was again ER positive.  Postoperatively she did have some fluid accumulation at the surgical site which did turn into what I believe is mastitis for which she was treated with appropriately antibiotics.  She also had her seroma drained.  Surgeon thought even with the 1 mm margin due to the final pathology showing intermediate grade DCIS it would be fine to proceed with radiation which I agree with.  She is seen today for radiation oncology opinion.  She has been seen by medical oncology with recommendation for endocrine therapy after completion of radiation.  Breast is much improved  now with almost no signs of mastitis still slight erythematous hue to the skin.  No warmth noted.  Mild seroma still appreciated.  PLANNED TREATMENT REGIMEN: Hypofractionated right whole breast radiation  PAST MEDICAL HISTORY:  has a past medical history of Ductal carcinoma in situ (DCIS) of right breast, History of kidney stones, and Hyperlipidemia.    PAST SURGICAL HISTORY:  Past Surgical History:  Procedure Laterality Date   BREAST BIOPSY Right 04/19/2023   MM RT BREAST BX W LOC DEV 1ST LESION IMAGE BX SPEC STEREO GUIDE 04/19/2023 [facility]   BREAST BIOPSY Right 04/19/2023   MM RT BREAST BX W LOC DEV EA AD LESION IMG BX SPEC STEREO GUIDE 04/19/2023 [facility]   BREAST BIOPSY Right 06/20/2023   MM RT PLC BREAST LOC DEV   1ST LESION  INC MAMMO GUIDE 06/20/2023 [facility]   BREAST BIOPSY Right 07/04/2023   MM RT PLC BREAST LOC DEV   1ST LESION  INC STEREO GUIDE 07/04/2023 [facility]   BREAST LUMPECTOMY Right 07/04/2023   Procedure: BREAST LUMPECTOMY;  Surgeon: Rodolph Romano, MD;  Location: [facility];  Service: General;  Laterality: Right;  with needle localization   WISDOM TOOTH EXTRACTION      FAMILY HISTORY: family history includes ALS in her paternal grandfather; Melanoma in her mother; Ovarian cancer in her paternal aunt; Prostate cancer in her father.  SOCIAL HISTORY:  reports that she has been smoking cigarettes. She has a 2.8 pack-year smoking history. She has never used smokeless tobacco. She reports current alcohol use. She reports  that she does not use drugs.  ALLERGIES: Lactose intolerance (gi), Penicillins, Simvastatin, Sulfa  antibiotics, and Epinephrine   MEDICATIONS:  Current Outpatient Medications  Medication Sig Dispense Refill   acetaminophen  (TYLENOL ) 500 MG tablet Take 500 mg by mouth every 6 (six) hours as needed for moderate pain (pain score 4-6).     albuterol (VENTOLIN HFA) 108 (90 Base) MCG/ACT inhaler Inhale 1-2 puffs into the lungs every  6 (six) hours as needed for wheezing or shortness of breath (FOR ALLERGIES ONLY).     Coenzyme Q10 (CO Q 10 PO) Take 1 tablet by mouth daily at 6 (six) AM.     cyanocobalamin (VITAMIN B12) 1000 MCG tablet Take 1,000 mcg by mouth daily.     diphenhydrAMINE (BENADRYL) 25 MG tablet Take 25 mg by mouth every 6 (six) hours as needed for allergies.     doxycycline (VIBRA-TABS) 100 MG tablet Take 100 mg by mouth.     ivermectin (STROMECTOL) 3 MG TABS tablet Take 150 mcg/kg by mouth.     ketoconazole (NIZORAL) 2 % shampoo Apply 1 Application topically once a week.     Moringa Oleifera (MORINGA PO) Take 2 tablets by mouth daily.     Multiple Vitamin (MULTIVITAMIN WITH MINERALS) TABS tablet Take 1 tablet by mouth daily.     OVER THE COUNTER MEDICATION Take 4 drops by mouth daily. Silver water      oxyCODONE -acetaminophen  (PERCOCET) 5-325 MG tablet Take 1 tablet by mouth every 4 (four) hours as needed for severe pain (pain score 7-10). 20 tablet 0   predniSONE (DELTASONE) 20 MG tablet Take by mouth.     rosuvastatin (CRESTOR) 10 MG tablet Take 10 mg by mouth at bedtime.     Vitamin D-Vitamin K (VITAMIN K2-VITAMIN D3 PO) Take 1 capsule by mouth daily.     VITAMIN E PO Take 1 tablet by mouth daily.     No current facility-administered medications for this encounter.    ECOG PERFORMANCE STATUS:  0 - Asymptomatic  REVIEW OF SYSTEMS: Patient denies any weight loss, fatigue, weakness, fever, chills or night sweats. Patient denies any loss of vision, blurred vision. Patient denies any ringing  of the ears or hearing loss. No irregular heartbeat. Patient denies heart murmur or history of fainting. Patient denies any chest pain or pain radiating to her upper extremities. Patient denies any shortness of breath, difficulty breathing at night, cough or hemoptysis. Patient denies any swelling in the lower legs. Patient denies any nausea vomiting, vomiting of blood, or coffee ground material in the vomitus. Patient  denies any stomach pain. Patient states has had normal bowel movements no significant constipation or diarrhea. Patient denies any dysuria, hematuria or significant nocturia. Patient denies any problems walking, swelling in the joints or loss of balance. Patient denies any skin changes, loss of hair or loss of weight. Patient denies any excessive worrying or anxiety or significant depression. Patient denies any problems with insomnia. Patient denies excessive thirst, polyuria, polydipsia. Patient denies any swollen glands, patient denies easy bruising or easy bleeding. Patient denies any recent infections, allergies or URI. Patient s visual fields have not changed significantly in recent time.   PHYSICAL EXAM: There were no vitals taken for this visit. She status post wide local excision of the right breast.  Mild seroma noted.  No other dominant masses noted in either breast no axillary or supraclavicular adenopathy is appreciated.  Well-developed well-nourished patient in NAD. HEENT reveals PERLA, EOMI, discs not visualized.  Oral cavity is clear.  No oral mucosal lesions are identified. Neck is clear without evidence of cervical or supraclavicular adenopathy. Lungs are clear to A&P. Cardiac examination is essentially unremarkable with regular rate and rhythm without murmur rub or thrill. Abdomen is benign with no organomegaly or masses noted. Motor sensory and DTR levels are equal and symmetric in the upper and lower extremities. Cranial nerves II through XII are grossly intact. Proprioception is intact. No peripheral adenopathy or edema is identified. No motor or sensory levels are noted. Crude visual fields are within normal range.  LABORATORY DATA: Pathology reports reviewed    RADIOLOGY RESULTS: MRI scans mammograms and ultrasound all reviewed compatible with above-stated findings   IMPRESSION: Stage 0 ER positive ductal carcinoma in situ of the right breast status post wide local excision in  53 year old female  PLAN: I agree with no further surgery at this point especially based on her postoperative complications.  I would plan on delivering whole breast radiation over 3 weeks boosting her scar another 1600 cGy using photon beam.  Risks and benefits of treatment occluding skin reaction fatigue alteration blood counts possible inclusion of superficial lung all were described in detail to the patient.  I have set her up for simulation at the end of next week to allow some further resolution of her mastitis.  Patient also will benefit from endocrine therapy after completion of radiation.  Patient comprehends my recommendations well.  I would like to take this opportunity to thank you for allowing me to participate in the care of your patient.SABRA Marcey Penton, MD

## 2023-07-26 NOTE — Progress Notes (Signed)
 Patient is feeling better from last week. Still some soreness, she sees her surgeon this afternoon. She is icing her breast to help.

## 2023-07-26 NOTE — Progress Notes (Signed)
 [facility]  [phone] [phone]  ID: Corean Ahle OB: 09/19/1970  MR#: [id number]  RDW#:[id number]  Patient Care Team: Cleotilde Oneil FALCON, MD as PCP - General (Internal Medicine) Georgina Shasta POUR, RN as Oncology Nurse Navigator Jacobo, Evalene PARAS, MD as Consulting Physician (Oncology)  CHIEF COMPLAINT: DCIS right breast.  INTERVAL HISTORY: Patient returns to clinic today for further evaluation, discussion of her final pathology results, and treatment planning.  She underwent lumpectomy on July 04, 2023 and had a difficult postoperative course with significant cellulitis on her breast and then allergic reaction to sulfa  antibiotics.  She is now on doxycycline with improvement of her cellulitis and resolution of her drug rash.  She otherwise feels well.  She has no neurologic complaints.  She denies any recent fevers or illnesses.  She has a good appetite and denies weight loss.  She has no chest pain, shortness of breath, cough, or hemoptysis.  She denies any nausea, vomiting, constipation, or diarrhea.  She has no urinary complaints.  Patient offers no further specific complaints today.  REVIEW OF SYSTEMS:   Review of Systems  Constitutional: Negative.  Negative for fever, malaise/fatigue and weight loss.  Respiratory: Negative.  Negative for cough, hemoptysis and shortness of breath.   Cardiovascular: Negative.  Negative for chest pain and leg swelling.  Gastrointestinal: Negative.  Negative for abdominal pain.  Genitourinary: Negative.  Negative for dysuria.  Musculoskeletal: Negative.  Negative for back pain.  Skin: Negative.  Negative for rash.  Neurological: Negative.  Negative for dizziness, focal weakness, weakness and headaches.  Psychiatric/Behavioral: Negative.  The patient is not nervous/anxious.     As per HPI. Otherwise, a complete review of systems is negative.  PAST MEDICAL HISTORY: Past Medical History:  Diagnosis Date   Ductal  carcinoma in situ (DCIS) of right breast    History of kidney stones    Hyperlipidemia     PAST SURGICAL HISTORY: Past Surgical History:  Procedure Laterality Date   BREAST BIOPSY Right 04/19/2023   MM RT BREAST BX W LOC DEV 1ST LESION IMAGE BX SPEC STEREO GUIDE 04/19/2023 [facility]   BREAST BIOPSY Right 04/19/2023   MM RT BREAST BX W LOC DEV EA AD LESION IMG BX SPEC STEREO GUIDE 04/19/2023 [facility]   BREAST BIOPSY Right 06/20/2023   MM RT PLC BREAST LOC DEV   1ST LESION  INC MAMMO GUIDE 06/20/2023 [facility]   BREAST BIOPSY Right 07/04/2023   MM RT PLC BREAST LOC DEV   1ST LESION  INC STEREO GUIDE 07/04/2023 [facility]   BREAST LUMPECTOMY Right 07/04/2023   Procedure: BREAST LUMPECTOMY;  Surgeon: Rodolph Romano, MD;  Location: [facility];  Service: General;  Laterality: Right;  with needle localization   WISDOM TOOTH EXTRACTION      FAMILY HISTORY: Family History  Problem Relation Age of Onset   Melanoma Mother        metastatic   Prostate cancer Father    Ovarian cancer Paternal Aunt    ALS Paternal Grandfather     ADVANCED DIRECTIVES (Y/N):  N  HEALTH MAINTENANCE: Social History   Tobacco Use   Smoking status: Light Smoker    Current packs/day: 0.14    Average packs/day: 0.1 packs/day for 20.0 years (2.8 ttl pk-yrs)    Types: Cigarettes   Smokeless tobacco: Never  Vaping Use   Vaping status: Never Used  Substance Use Topics   Alcohol use: Yes    Comment: social   Drug use: Never  Colonoscopy:  PAP:  Bone density:  Lipid panel:  Allergies  Allergen Reactions   Lactose Intolerance (Gi)     Upsets stomach   Penicillins Itching   Simvastatin Other (See Comments)    Unknown reaction    Sulfa  Antibiotics    Epinephrine  Palpitations    Current Outpatient Medications  Medication Sig Dispense Refill   acetaminophen  (TYLENOL ) 500 MG tablet Take 500 mg by mouth every 6 (six) hours as needed for moderate pain (pain score 4-6).      albuterol (VENTOLIN HFA) 108 (90 Base) MCG/ACT inhaler Inhale 1-2 puffs into the lungs every 6 (six) hours as needed for wheezing or shortness of breath (FOR ALLERGIES ONLY).     Coenzyme Q10 (CO Q 10 PO) Take 1 tablet by mouth daily at 6 (six) AM.     cyanocobalamin (VITAMIN B12) 1000 MCG tablet Take 1,000 mcg by mouth daily.     diphenhydrAMINE (BENADRYL) 25 MG tablet Take 25 mg by mouth every 6 (six) hours as needed for allergies.     doxycycline (VIBRA-TABS) 100 MG tablet Take 100 mg by mouth.     ivermectin (STROMECTOL) 3 MG TABS tablet Take 150 mcg/kg by mouth.     ketoconazole (NIZORAL) 2 % shampoo Apply 1 Application topically once a week.     Moringa Oleifera (MORINGA PO) Take 2 tablets by mouth daily.     Multiple Vitamin (MULTIVITAMIN WITH MINERALS) TABS tablet Take 1 tablet by mouth daily.     OVER THE COUNTER MEDICATION Take 4 drops by mouth daily. Silver water      oxyCODONE -acetaminophen  (PERCOCET) 5-325 MG tablet Take 1 tablet by mouth every 4 (four) hours as needed for severe pain (pain score 7-10). 20 tablet 0   predniSONE (DELTASONE) 20 MG tablet Take by mouth.     rosuvastatin (CRESTOR) 10 MG tablet Take 10 mg by mouth at bedtime.     Vitamin D-Vitamin K (VITAMIN K2-VITAMIN D3 PO) Take 1 capsule by mouth daily.     VITAMIN E PO Take 1 tablet by mouth daily.     No current facility-administered medications for this visit.    OBJECTIVE: Vitals:   07/26/23 0930  BP: (!) 133/102  Pulse: (!) 59  Resp: 18  Temp: 97.7 F (36.5 C)  SpO2: 100%     Body mass index is 32.03 kg/m.    ECOG FS:0 - Asymptomatic  General: Well-developed, well-nourished, no acute distress. Eyes: Pink conjunctiva, anicteric sclera. HEENT: Normocephalic, moist mucous membranes. Left breast: Well-healing surgical scar.  Erythema significantly reduced. Lungs: No audible wheezing or coughing. Heart: Regular rate and rhythm. Abdomen: Soft, nontender, no obvious distention. Musculoskeletal: No edema,  cyanosis, or clubbing. Neuro: Alert, answering all questions appropriately. Cranial nerves grossly intact. Skin: No rashes or petechiae noted. Psych: Normal affect.  LAB RESULTS:  Lab Results  Component Value Date   NA 136 08/05/2022   K 3.4 (L) 08/05/2022   CL 104 08/05/2022   CO2 21 (L) 08/05/2022   GLUCOSE 108 (H) 08/05/2022   BUN 14 08/05/2022   CREATININE 0.75 08/05/2022   CALCIUM 9.1 08/05/2022   PROT 7.5 08/05/2022   ALBUMIN 4.6 08/05/2022   AST 21 08/05/2022   ALT 15 08/05/2022   ALKPHOS 61 08/05/2022   BILITOT 0.6 08/05/2022   GFRNONAA >60 08/05/2022    Lab Results  Component Value Date   WBC 9.7 08/05/2022   HGB 14.7 08/05/2022   HCT 44.3 08/05/2022   MCV 87.0 08/05/2022   PLT  233 08/05/2022     STUDIES: DG BREAST SURGICAL SPECIMEN NO CHARGE Result Date: 07/06/2023 This procedure is a no report and no charge.  It will auto finalize.  MM Breast Surgical Specimen Result Date: 07/04/2023 CLINICAL DATA:  Specimen radiograph that is post RIGHT breast lumpectomy for DCIS. EXAM: SPECIMEN RADIOGRAPH OF THE RIGHT BREAST COMPARISON:  Previous exam(s). FINDINGS: Status post excision of the right breast. The two wire tips and ribbon and X shaped biopsy marker clips are present and are marked for pathology. IMPRESSION: Specimen radiograph of the right breast. Electronically Signed   By: Dirk Arrant M.D.   On: 07/04/2023 14:02   MM RT PLC BREAST LOC DEV   EA ADD LESION  INC MAMMO GUIDE Result Date: 07/04/2023 CLINICAL DATA:  53 year old female presenting for bracketed wire localization of 2 areas of biopsy-proven DCIS in the RIGHT breast. EXAM: MAMMOGRAPHIC GUIDED WIRE LOCALIZATION OF THE RIGHT BREAST COMPARISON:  Previous exam(s). FINDINGS: Patient presents for wire localization prior to right breast lumpectomy. I met with the patient and we discussed the procedure of seed localization including benefits and alternatives. We discussed the high likelihood of a successful  procedure. We discussed the risks of the procedure including infection, bleeding, tissue injury and further surgery. Informed, written consent was given. The usual time-out protocol was performed immediately prior to the procedure. Using mammographic guidance, sterile technique, 1% lidocaine , and a 15 cm modified Kopan's needle, the posterior aspect of the RIBBON clip was localized using a lateral approach. Using mammographic guidance, sterile technique, 1% lidocaine , and a 9 cm modified Kopan's needle, the anterior aspect of the X clip was localized using a lateral approach. The patient tolerated the procedure well and was released from the [facility]. She was given instructions regarding seed removal. IMPRESSION: Bracketed wire localization of the RIGHT breast. No apparent complications. Electronically Signed   By: Dirk Arrant M.D.   On: 07/04/2023 11:16   MM RT PLC BREAST LOC DEV   1ST LESION  INC STEREO GUIDE Result Date: 07/04/2023 CLINICAL DATA:  53 year old female presenting for bracketed wire localization of 2 areas of biopsy-proven DCIS in the RIGHT breast. EXAM: MAMMOGRAPHIC GUIDED WIRE LOCALIZATION OF THE RIGHT BREAST COMPARISON:  Previous exam(s). FINDINGS: Patient presents for wire localization prior to right breast lumpectomy. I met with the patient and we discussed the procedure of seed localization including benefits and alternatives. We discussed the high likelihood of a successful procedure. We discussed the risks of the procedure including infection, bleeding, tissue injury and further surgery. Informed, written consent was given. The usual time-out protocol was performed immediately prior to the procedure. Using mammographic guidance, sterile technique, 1% lidocaine , and a 15 cm modified Kopan's needle, the posterior aspect of the RIBBON clip was localized using a lateral approach. Using mammographic guidance, sterile technique, 1% lidocaine , and a 9 cm modified Kopan's needle, the  anterior aspect of the X clip was localized using a lateral approach. The patient tolerated the procedure well and was released from the Breast Center. She was given instructions regarding seed removal. IMPRESSION: Bracketed wire localization of the RIGHT breast. No apparent complications. Electronically Signed   By: Dirk Arrant M.D.   On: 07/04/2023 11:16    ASSESSMENT: DCIS right breast.  PLAN:    DCIS right breast: Imaging and pathology reviewed independently.  Patient also had a breast MRI on May 18, 2023.  No pathologically enlarged lymph nodes were noted.  Lumpectomy completed on July 04, 2023 confirmed diagnosis.  Patient does not require adjuvant chemotherapy, but will benefit from adjuvant XRT and has an appointment with radiation oncology later this morning.  Radiation treatments will likely need to be delayed until the cellulitis of her breast has resolved.  Patient will also benefit from tamoxifen for total of 5 years after completing her XRT for no patient stated today she is considering declining treatment.  No further intervention is needed.  Follow-up at the end of XRT for further evaluation and discussion of initiating tamoxifen.  Left breast cellulitis: Improved with doxycycline.  Surgery and infectious disease as indicated.    I spent a total of 30 minutes reviewing chart data, face-to-face evaluation with the patient, counseling and coordination of care as detailed above.  Patient expressed understanding and was in agreement with this plan. She also understands that She can call clinic at any time with any questions, concerns, or complaints.    Cancer Staging  Ductal carcinoma in situ (DCIS) of right breast Staging form: Breast, AJCC 8th Edition - Clinical stage from 05/23/2023: Stage 0 (cTis (DCIS), cN0, cM0, G2, ER+, PR+, HER2-) - Signed by Jacobo Evalene PARAS, MD on 05/23/2023 Stage prefix: Initial diagnosis Histologic grading system: 3 grade system   Evalene PARAS Jacobo, MD   07/26/2023 9:52 AM

## 2023-07-27 ENCOUNTER — Telehealth: Payer: Self-pay

## 2023-07-27 NOTE — Telephone Encounter (Signed)
 Clinical Social Work was referred by [facility] for assessment of psychosocial needs.  CSW attempted to contact patient by phone.  Voicemail was full.  Will attempt to contact at another time.

## 2023-07-30 ENCOUNTER — Telehealth: Payer: Self-pay

## 2023-07-30 NOTE — Telephone Encounter (Signed)
 Clinical Social Work was referred by medical provider for assessment of psychosocial needs.  CSW attempted to contact patient by phone a second time.  The voicemail was full and CSW was unable to leave a message.

## 2023-08-01 NOTE — Progress Notes (Unsigned)
 08/03/2023 2:44 PM   Marisa Figueroa 1970/08/15 [id number]  Referring provider: Cleotilde Oneil FALCON, MD [phone] [facility] [address] [facility] [place],  [place] [address]  Urological history: 1. Nephrolithiasis - Stone composition 100% calcium oxalate dihydrate -contrast CT (07/2022) - Punctate less than 2 mm obstructing distal right ureteral calculus, with mild right-sided hydronephrosis and hydroureter.  5 mm nonobstructing left renal calculus.  No chief complaint on file.  HPI: Marisa Figueroa is a 53 y.o. female who presents today for yearly follow up.   Previous records reviewed.   KUB ***   PMH: Past Medical History:  Diagnosis Date   Ductal carcinoma in situ (DCIS) of right breast    History of kidney stones    Hyperlipidemia     Surgical History: Past Surgical History:  Procedure Laterality Date   BREAST BIOPSY Right 04/19/2023   MM RT BREAST BX W LOC DEV 1ST LESION IMAGE BX SPEC STEREO GUIDE 04/19/2023 [facility]   BREAST BIOPSY Right 04/19/2023   MM RT BREAST BX W LOC DEV EA AD LESION IMG BX SPEC STEREO GUIDE 04/19/2023 [facility]   BREAST BIOPSY Right 06/20/2023   MM RT PLC BREAST LOC DEV   1ST LESION  INC MAMMO GUIDE 06/20/2023 [facility]   BREAST BIOPSY Right 07/04/2023   MM RT PLC BREAST LOC DEV   1ST LESION  INC STEREO GUIDE 07/04/2023 [facility]   BREAST LUMPECTOMY Right 07/04/2023   Procedure: BREAST LUMPECTOMY;  Surgeon: Rodolph Romano, MD;  Location: [facility];  Service: General;  Laterality: Right;  with needle localization   WISDOM TOOTH EXTRACTION      Home Medications:  Allergies as of 08/03/2023       Reactions   Lactose Intolerance (gi)    Upsets stomach   Penicillins Itching   Simvastatin Other (See Comments)   Unknown reaction    Sulfa  Antibiotics    Epinephrine  Palpitations        Medication List        Accurate as of August 01, 2023  2:44 PM. If you have any questions, ask your nurse or  doctor.          acetaminophen  500 MG tablet Commonly known as: TYLENOL  Take 500 mg by mouth every 6 (six) hours as needed for moderate pain (pain score 4-6).   albuterol 108 (90 Base) MCG/ACT inhaler Commonly known as: VENTOLIN HFA Inhale 1-2 puffs into the lungs every 6 (six) hours as needed for wheezing or shortness of breath (FOR ALLERGIES ONLY).   CO Q 10 PO Take 1 tablet by mouth daily at 6 (six) AM.   cyanocobalamin 1000 MCG tablet Commonly known as: VITAMIN B12 Take 1,000 mcg by mouth daily.   diphenhydrAMINE 25 MG tablet Commonly known as: BENADRYL Take 25 mg by mouth every 6 (six) hours as needed for allergies.   ivermectin 3 MG Tabs tablet Commonly known as: STROMECTOL Take 150 mcg/kg by mouth.   ketoconazole 2 % shampoo Commonly known as: NIZORAL Apply 1 Application topically once a week.   MORINGA PO Take 2 tablets by mouth daily.   multivitamin with minerals Tabs tablet Take 1 tablet by mouth daily.   OVER THE COUNTER MEDICATION Take 4 drops by mouth daily. Silver water    oxyCODONE -acetaminophen  5-325 MG tablet Commonly known as: Percocet Take 1 tablet by mouth every 4 (four) hours as needed for severe pain (pain score 7-10).   rosuvastatin 10 MG tablet Commonly known as: CRESTOR Take 10 mg by mouth  at bedtime.   VITAMIN E PO Take 1 tablet by mouth daily.   VITAMIN K2-VITAMIN D3 PO Take 1 capsule by mouth daily.        Allergies:  Allergies  Allergen Reactions   Lactose Intolerance (Gi)     Upsets stomach   Penicillins Itching   Simvastatin Other (See Comments)    Unknown reaction    Sulfa  Antibiotics    Epinephrine  Palpitations    Family History: Family History  Problem Relation Age of Onset   Melanoma Mother        metastatic   Prostate cancer Father    Ovarian cancer Paternal Aunt    ALS Paternal Grandfather     Social History:  reports that she has been smoking cigarettes. She has a 2.8 pack-year smoking history. She  has never used smokeless tobacco. She reports current alcohol use. She reports that she does not use drugs.  ROS: Pertinent ROS in HPI  Physical Exam: There were no vitals taken for this visit.  Constitutional:  Well nourished. Alert and oriented, No acute distress. HEENT: [place] AT, moist mucus membranes.  Trachea midline, no masses. Cardiovascular: No clubbing, cyanosis, or edema. Respiratory: Normal respiratory effort, no increased work of breathing. GU: No CVA tenderness.  No bladder fullness or masses.  Recession of labia minora, dry, pale vulvar vaginal mucosa and loss of mucosal ridges and folds.  Normal urethral meatus, no lesions, no prolapse, no discharge.   No urethral masses, tenderness and/or tenderness. No bladder fullness, tenderness or masses. *** vagina mucosa, *** estrogen effect, no discharge, no lesions, *** pelvic support, *** cystocele and *** rectocele noted.  No cervical motion tenderness.  Uterus is freely mobile and non-fixed.  No adnexal/parametria masses or tenderness noted.  Anus and perineum are without rashes or lesions.   ***  Neurologic: Grossly intact, no focal deficits, moving all 4 extremities. Psychiatric: Normal mood and affect.    Laboratory Data: See HPI and EPIC  I have reviewed the labs.   Pertinent Imaging: KUB *** I have independently reviewed the films.  See HPI.   Assessment & Plan:    1. Left renal stone -KUB stable left renal stone    No follow-ups on file.  These notes generated with voice recognition software. I apologize for typographical errors.  Marisa Figueroa  [facility] [address]  [address] [place], [place] [address] [phone]

## 2023-08-02 ENCOUNTER — Ambulatory Visit
Admission: RE | Admit: 2023-08-02 | Discharge: 2023-08-02 | Disposition: A | Discharge status: Home / Self Care | Source: Ambulatory Visit | Attending: Radiation Oncology | Admitting: Radiation Oncology

## 2023-08-02 DIAGNOSIS — D0511 Intraductal carcinoma in situ of right breast: Secondary | ICD-10-CM | POA: Diagnosis not present

## 2023-08-03 ENCOUNTER — Ambulatory Visit: Payer: Self-pay | Admitting: Urology

## 2023-08-03 ENCOUNTER — Ambulatory Visit
Admission: RE | Admit: 2023-08-03 | Discharge: 2023-08-03 | Disposition: A | Discharge status: Home / Self Care | Source: Ambulatory Visit | Attending: Urology | Admitting: Urology

## 2023-08-03 ENCOUNTER — Ambulatory Visit
Admission: RE | Admit: 2023-08-03 | Discharge: 2023-08-03 | Disposition: A | Discharge status: Home / Self Care | Attending: Urology | Admitting: Urology

## 2023-08-03 ENCOUNTER — Encounter: Payer: Self-pay | Admitting: Urology

## 2023-08-03 VITALS — BP 122/83 | HR 65 | Ht 60.0 in | Wt 165.0 lb

## 2023-08-03 DIAGNOSIS — R1030 Lower abdominal pain, unspecified: Secondary | ICD-10-CM

## 2023-08-03 DIAGNOSIS — N200 Calculus of kidney: Secondary | ICD-10-CM

## 2023-08-03 NOTE — Patient Instructions (Signed)
Kidney stones can form due to one or all of the following:   1. Low fluid Intake  2. Low citrate in the urine  3. Too much salt, oxalate, and animal protein   The following dietary changes may decrease your risk of forming stones.   1. Increase your fluid intake to around 2.5 - 3 liters per day. Diluting the urine hinders the formation of stones. You should drink enough fluid to make 2 liters of urine a day. You know you are drinking enough fluid if your urine is clear.   2. Decrease your salt intake to less than 2000mg/day  This will decrease the amount of calcium excreted in your urine. Eat more fresh or frozen vegetables instead of canned. Eat less processed meats. At restaurants request your food to be prepared without salt or high sodium seasoning.   3. Limit the amount of Oxalate containing foods in your diet to 50mg per day.  This will decrease the amount of oxalate excreted in your urine.   Spinach  Potato Nuts  Peanut Butter  Chocolate   4. Limit the amount of animal proteins in your diet to approximately the size of 1 deck of cards per meal This will decrease the amount of uric acid excreted in your urine.   Fish  Liver  Chicken  Red Meat - no more than 3 servings per week   5. Increase the amount of citrate in your diet. Most fruits and vegetables are high in citrate. Increasing the amount of fruit and vegetable your diet will raise your citrate.  Lemons and limes have the highest amount of citrate. A recipe for lemonade may be helpful to increase citrate in your diet: 1/2 cup concentrated lemon juice to 2 quarts of water. Sweeten to taste.  

## 2023-08-06 ENCOUNTER — Encounter: Payer: Self-pay | Admitting: *Deleted

## 2023-08-06 DIAGNOSIS — D0511 Intraductal carcinoma in situ of right breast: Secondary | ICD-10-CM | POA: Diagnosis not present

## 2023-08-10 ENCOUNTER — Other Ambulatory Visit: Payer: Self-pay | Admitting: *Deleted

## 2023-08-10 DIAGNOSIS — D0511 Intraductal carcinoma in situ of right breast: Secondary | ICD-10-CM

## 2023-08-13 ENCOUNTER — Ambulatory Visit
Admission: RE | Admit: 2023-08-13 | Discharge: 2023-08-13 | Disposition: A | Discharge status: Home / Self Care | Source: Ambulatory Visit | Attending: Radiation Oncology | Admitting: Radiation Oncology

## 2023-08-13 DIAGNOSIS — Z170 Estrogen receptor positive status [ER+]: Secondary | ICD-10-CM | POA: Insufficient documentation

## 2023-08-13 DIAGNOSIS — D0511 Intraductal carcinoma in situ of right breast: Secondary | ICD-10-CM | POA: Diagnosis present

## 2023-08-13 DIAGNOSIS — Z510 Encounter for antineoplastic radiation therapy: Secondary | ICD-10-CM | POA: Diagnosis present

## 2023-08-13 DIAGNOSIS — F17210 Nicotine dependence, cigarettes, uncomplicated: Secondary | ICD-10-CM | POA: Insufficient documentation

## 2023-08-14 ENCOUNTER — Other Ambulatory Visit: Payer: Self-pay

## 2023-08-14 ENCOUNTER — Ambulatory Visit
Admission: RE | Admit: 2023-08-14 | Discharge: 2023-08-14 | Disposition: A | Discharge status: Home / Self Care | Source: Ambulatory Visit | Attending: Radiation Oncology | Admitting: Radiation Oncology

## 2023-08-14 DIAGNOSIS — Z510 Encounter for antineoplastic radiation therapy: Secondary | ICD-10-CM | POA: Diagnosis not present

## 2023-08-14 LAB — RAD ONC ARIA SESSION SUMMARY
Course Elapsed Days: 0
Plan Fractions Treated to Date: 1
Plan Prescribed Dose Per Fraction: 2.66 Gy
Plan Total Fractions Prescribed: 16
Plan Total Prescribed Dose: 42.56 Gy
Reference Point Dosage Given to Date: 2.66 Gy
Reference Point Session Dosage Given: 2.66 Gy
Session Number: 1

## 2023-08-15 ENCOUNTER — Ambulatory Visit
Admission: RE | Admit: 2023-08-15 | Discharge: 2023-08-15 | Disposition: A | Discharge status: Home / Self Care | Source: Ambulatory Visit | Attending: Radiation Oncology | Admitting: Radiation Oncology

## 2023-08-15 ENCOUNTER — Other Ambulatory Visit: Payer: Self-pay

## 2023-08-15 DIAGNOSIS — Z510 Encounter for antineoplastic radiation therapy: Secondary | ICD-10-CM | POA: Diagnosis not present

## 2023-08-15 LAB — RAD ONC ARIA SESSION SUMMARY
Course Elapsed Days: 1
Plan Fractions Treated to Date: 2
Plan Prescribed Dose Per Fraction: 2.66 Gy
Plan Total Fractions Prescribed: 16
Plan Total Prescribed Dose: 42.56 Gy
Reference Point Dosage Given to Date: 5.32 Gy
Reference Point Session Dosage Given: 2.66 Gy
Session Number: 2

## 2023-08-16 ENCOUNTER — Ambulatory Visit
Admission: RE | Admit: 2023-08-16 | Discharge: 2023-08-16 | Disposition: A | Discharge status: Home / Self Care | Source: Ambulatory Visit | Attending: Radiation Oncology | Admitting: Radiation Oncology

## 2023-08-16 ENCOUNTER — Other Ambulatory Visit: Payer: Self-pay

## 2023-08-16 DIAGNOSIS — Z510 Encounter for antineoplastic radiation therapy: Secondary | ICD-10-CM | POA: Diagnosis not present

## 2023-08-16 LAB — RAD ONC ARIA SESSION SUMMARY
Course Elapsed Days: 2
Plan Fractions Treated to Date: 3
Plan Prescribed Dose Per Fraction: 2.66 Gy
Plan Total Fractions Prescribed: 16
Plan Total Prescribed Dose: 42.56 Gy
Reference Point Dosage Given to Date: 7.98 Gy
Reference Point Session Dosage Given: 2.66 Gy
Session Number: 3

## 2023-08-17 ENCOUNTER — Ambulatory Visit
Admission: RE | Admit: 2023-08-17 | Discharge: 2023-08-17 | Disposition: A | Discharge status: Home / Self Care | Source: Ambulatory Visit | Attending: Radiation Oncology | Admitting: Radiation Oncology

## 2023-08-17 ENCOUNTER — Other Ambulatory Visit: Payer: Self-pay

## 2023-08-17 DIAGNOSIS — Z510 Encounter for antineoplastic radiation therapy: Secondary | ICD-10-CM | POA: Diagnosis not present

## 2023-08-17 LAB — RAD ONC ARIA SESSION SUMMARY
Course Elapsed Days: 3
Plan Fractions Treated to Date: 4
Plan Prescribed Dose Per Fraction: 2.66 Gy
Plan Total Fractions Prescribed: 16
Plan Total Prescribed Dose: 42.56 Gy
Reference Point Dosage Given to Date: 10.64 Gy
Reference Point Session Dosage Given: 2.66 Gy
Session Number: 4

## 2023-08-20 ENCOUNTER — Inpatient Hospital Stay: Attending: Oncology

## 2023-08-20 ENCOUNTER — Other Ambulatory Visit: Payer: Self-pay

## 2023-08-20 ENCOUNTER — Ambulatory Visit
Admission: RE | Admit: 2023-08-20 | Discharge: 2023-08-20 | Disposition: A | Discharge status: Home / Self Care | Source: Ambulatory Visit | Attending: Radiation Oncology | Admitting: Radiation Oncology

## 2023-08-20 DIAGNOSIS — Z510 Encounter for antineoplastic radiation therapy: Secondary | ICD-10-CM | POA: Diagnosis not present

## 2023-08-20 DIAGNOSIS — D0511 Intraductal carcinoma in situ of right breast: Secondary | ICD-10-CM | POA: Insufficient documentation

## 2023-08-20 LAB — RAD ONC ARIA SESSION SUMMARY
Course Elapsed Days: 6
Plan Fractions Treated to Date: 5
Plan Prescribed Dose Per Fraction: 2.66 Gy
Plan Total Fractions Prescribed: 16
Plan Total Prescribed Dose: 42.56 Gy
Reference Point Dosage Given to Date: 13.3 Gy
Reference Point Session Dosage Given: 2.66 Gy
Session Number: 5

## 2023-08-20 LAB — CBC (CANCER CENTER ONLY)
HCT: 40.4 % (ref 36.0–46.0)
Hemoglobin: 13.9 g/dL (ref 12.0–15.0)
MCH: 29.3 pg (ref 26.0–34.0)
MCHC: 34.4 g/dL (ref 30.0–36.0)
MCV: 85.1 fL (ref 80.0–100.0)
Platelet Count: 255 K/uL (ref 150–400)
RBC: 4.75 MIL/uL (ref 3.87–5.11)
RDW: 12.5 % (ref 11.5–15.5)
WBC Count: 6.6 K/uL (ref 4.0–10.5)
nRBC: 0 % (ref 0.0–0.2)

## 2023-08-21 ENCOUNTER — Other Ambulatory Visit: Payer: Self-pay

## 2023-08-21 ENCOUNTER — Ambulatory Visit
Admission: RE | Admit: 2023-08-21 | Discharge: 2023-08-21 | Disposition: A | Discharge status: Home / Self Care | Source: Ambulatory Visit | Attending: Radiation Oncology | Admitting: Radiation Oncology

## 2023-08-21 DIAGNOSIS — Z510 Encounter for antineoplastic radiation therapy: Secondary | ICD-10-CM | POA: Diagnosis not present

## 2023-08-21 LAB — RAD ONC ARIA SESSION SUMMARY
Course Elapsed Days: 7
Plan Fractions Treated to Date: 6
Plan Prescribed Dose Per Fraction: 2.66 Gy
Plan Total Fractions Prescribed: 16
Plan Total Prescribed Dose: 42.56 Gy
Reference Point Dosage Given to Date: 15.96 Gy
Reference Point Session Dosage Given: 2.66 Gy
Session Number: 6

## 2023-08-22 ENCOUNTER — Other Ambulatory Visit: Payer: Self-pay

## 2023-08-22 ENCOUNTER — Ambulatory Visit
Admission: RE | Admit: 2023-08-22 | Discharge: 2023-08-22 | Disposition: A | Discharge status: Home / Self Care | Source: Ambulatory Visit | Attending: Radiation Oncology | Admitting: Radiation Oncology

## 2023-08-22 DIAGNOSIS — Z510 Encounter for antineoplastic radiation therapy: Secondary | ICD-10-CM | POA: Diagnosis not present

## 2023-08-22 LAB — RAD ONC ARIA SESSION SUMMARY
Course Elapsed Days: 8
Plan Fractions Treated to Date: 7
Plan Prescribed Dose Per Fraction: 2.66 Gy
Plan Total Fractions Prescribed: 16
Plan Total Prescribed Dose: 42.56 Gy
Reference Point Dosage Given to Date: 18.62 Gy
Reference Point Session Dosage Given: 2.66 Gy
Session Number: 7

## 2023-08-23 ENCOUNTER — Ambulatory Visit

## 2023-08-24 ENCOUNTER — Ambulatory Visit
Admission: RE | Admit: 2023-08-24 | Discharge: 2023-08-24 | Disposition: A | Discharge status: Home / Self Care | Source: Ambulatory Visit | Attending: Radiation Oncology | Admitting: Radiation Oncology

## 2023-08-24 ENCOUNTER — Other Ambulatory Visit: Payer: Self-pay

## 2023-08-24 DIAGNOSIS — Z510 Encounter for antineoplastic radiation therapy: Secondary | ICD-10-CM | POA: Diagnosis not present

## 2023-08-24 LAB — RAD ONC ARIA SESSION SUMMARY
Course Elapsed Days: 10
Plan Fractions Treated to Date: 8
Plan Prescribed Dose Per Fraction: 2.66 Gy
Plan Total Fractions Prescribed: 16
Plan Total Prescribed Dose: 42.56 Gy
Reference Point Dosage Given to Date: 21.28 Gy
Reference Point Session Dosage Given: 2.66 Gy
Session Number: 8

## 2023-08-27 ENCOUNTER — Ambulatory Visit
Admission: RE | Admit: 2023-08-27 | Discharge: 2023-08-27 | Disposition: A | Discharge status: Home / Self Care | Source: Ambulatory Visit | Attending: Radiation Oncology | Admitting: Radiation Oncology

## 2023-08-27 ENCOUNTER — Other Ambulatory Visit: Payer: Self-pay

## 2023-08-27 DIAGNOSIS — Z510 Encounter for antineoplastic radiation therapy: Secondary | ICD-10-CM | POA: Diagnosis not present

## 2023-08-27 LAB — RAD ONC ARIA SESSION SUMMARY
Course Elapsed Days: 13
Plan Fractions Treated to Date: 9
Plan Prescribed Dose Per Fraction: 2.66 Gy
Plan Total Fractions Prescribed: 16
Plan Total Prescribed Dose: 42.56 Gy
Reference Point Dosage Given to Date: 23.94 Gy
Reference Point Session Dosage Given: 2.66 Gy
Session Number: 9

## 2023-08-28 ENCOUNTER — Other Ambulatory Visit: Payer: Self-pay

## 2023-08-28 ENCOUNTER — Ambulatory Visit
Admission: RE | Admit: 2023-08-28 | Discharge: 2023-08-28 | Discharge status: Home / Self Care | Source: Ambulatory Visit | Attending: Radiation Oncology | Admitting: Radiation Oncology

## 2023-08-28 DIAGNOSIS — Z510 Encounter for antineoplastic radiation therapy: Secondary | ICD-10-CM | POA: Diagnosis not present

## 2023-08-28 LAB — RAD ONC ARIA SESSION SUMMARY
Course Elapsed Days: 14
Plan Fractions Treated to Date: 10
Plan Prescribed Dose Per Fraction: 2.66 Gy
Plan Total Fractions Prescribed: 16
Plan Total Prescribed Dose: 42.56 Gy
Reference Point Dosage Given to Date: 26.6 Gy
Reference Point Session Dosage Given: 2.66 Gy
Session Number: 10

## 2023-08-29 ENCOUNTER — Ambulatory Visit
Admission: RE | Admit: 2023-08-29 | Discharge: 2023-08-29 | Discharge status: Home / Self Care | Source: Ambulatory Visit | Attending: Radiation Oncology | Admitting: Radiation Oncology

## 2023-08-29 ENCOUNTER — Ambulatory Visit
Admission: RE | Admit: 2023-08-29 | Discharge: 2023-08-29 | Disposition: A | Discharge status: Home / Self Care | Source: Ambulatory Visit | Attending: Radiation Oncology | Admitting: Radiation Oncology

## 2023-08-29 ENCOUNTER — Encounter: Payer: Self-pay | Admitting: Occupational Therapy

## 2023-08-29 ENCOUNTER — Ambulatory Visit: Attending: General Surgery | Admitting: Occupational Therapy

## 2023-08-29 ENCOUNTER — Other Ambulatory Visit: Payer: Self-pay

## 2023-08-29 DIAGNOSIS — I972 Postmastectomy lymphedema syndrome: Secondary | ICD-10-CM | POA: Diagnosis present

## 2023-08-29 DIAGNOSIS — Z510 Encounter for antineoplastic radiation therapy: Secondary | ICD-10-CM | POA: Diagnosis not present

## 2023-08-29 LAB — RAD ONC ARIA SESSION SUMMARY
Course Elapsed Days: 15
Plan Fractions Treated to Date: 11
Plan Prescribed Dose Per Fraction: 2.66 Gy
Plan Total Fractions Prescribed: 16
Plan Total Prescribed Dose: 42.56 Gy
Reference Point Dosage Given to Date: 29.26 Gy
Reference Point Session Dosage Given: 2.66 Gy
Session Number: 11

## 2023-08-29 NOTE — Therapy (Unsigned)
o

## 2023-08-30 ENCOUNTER — Other Ambulatory Visit: Payer: Self-pay

## 2023-08-30 ENCOUNTER — Ambulatory Visit
Admission: RE | Admit: 2023-08-30 | Discharge: 2023-08-30 | Disposition: A | Discharge status: Home / Self Care | Source: Ambulatory Visit | Attending: Radiation Oncology | Admitting: Radiation Oncology

## 2023-08-30 DIAGNOSIS — Z510 Encounter for antineoplastic radiation therapy: Secondary | ICD-10-CM | POA: Diagnosis not present

## 2023-08-30 LAB — RAD ONC ARIA SESSION SUMMARY
Course Elapsed Days: 16
Plan Fractions Treated to Date: 12
Plan Prescribed Dose Per Fraction: 2.66 Gy
Plan Total Fractions Prescribed: 16
Plan Total Prescribed Dose: 42.56 Gy
Reference Point Dosage Given to Date: 31.92 Gy
Reference Point Session Dosage Given: 2.66 Gy
Session Number: 12

## 2023-08-31 ENCOUNTER — Encounter: Payer: Self-pay | Admitting: Occupational Therapy

## 2023-08-31 ENCOUNTER — Other Ambulatory Visit: Payer: Self-pay

## 2023-08-31 ENCOUNTER — Ambulatory Visit: Admitting: Occupational Therapy

## 2023-08-31 ENCOUNTER — Ambulatory Visit
Admission: RE | Admit: 2023-08-31 | Discharge: 2023-08-31 | Disposition: A | Discharge status: Home / Self Care | Source: Ambulatory Visit | Attending: Radiation Oncology | Admitting: Radiation Oncology

## 2023-08-31 DIAGNOSIS — I972 Postmastectomy lymphedema syndrome: Secondary | ICD-10-CM

## 2023-08-31 DIAGNOSIS — Z510 Encounter for antineoplastic radiation therapy: Secondary | ICD-10-CM | POA: Diagnosis not present

## 2023-08-31 LAB — RAD ONC ARIA SESSION SUMMARY
Course Elapsed Days: 17
Plan Fractions Treated to Date: 13
Plan Prescribed Dose Per Fraction: 2.66 Gy
Plan Total Fractions Prescribed: 16
Plan Total Prescribed Dose: 42.56 Gy
Reference Point Dosage Given to Date: 34.58 Gy
Reference Point Session Dosage Given: 2.66 Gy
Session Number: 13

## 2023-08-31 NOTE — Therapy (Addendum)
 OUTPATIENT OCCUPATIONAL THERAPY TREATMENT NOTE  RUE/RUQ Post-Mastectomy Lymphedema  Patient Name: Marisa Figueroa MRN: [id number] DOB:03-May-1970, 53 y.o., female Today's Date: 09/03/2023  END OF SESSION:  OT End of Session - 09/03/23 1132     Visit Number 2    Number of Visits 6    Date for OT Re-Evaluation 11/29/23    OT Start Time 1106    OT Stop Time 1208    OT Time Calculation (min) 62 min    Activity Tolerance Patient tolerated treatment well;No increased pain    Behavior During Therapy WFL for tasks assessed/performed            Past Medical History:  Diagnosis Date   Ductal carcinoma in situ (DCIS) of right breast    History of kidney stones    Hyperlipidemia    Past Surgical History:  Procedure Laterality Date   BREAST BIOPSY Right 04/19/2023   MM RT BREAST BX W LOC DEV 1ST LESION IMAGE BX SPEC STEREO GUIDE 04/19/2023 [facility]   BREAST BIOPSY Right 04/19/2023   MM RT BREAST BX W LOC DEV EA AD LESION IMG BX SPEC STEREO GUIDE 04/19/2023 [facility]   BREAST BIOPSY Right 06/20/2023   MM RT PLC BREAST LOC DEV   1ST LESION  INC MAMMO GUIDE 06/20/2023 [facility]   BREAST BIOPSY Right 07/04/2023   MM RT PLC BREAST LOC DEV   1ST LESION  INC STEREO GUIDE 07/04/2023 [facility]   BREAST LUMPECTOMY Right 07/04/2023   Procedure: BREAST LUMPECTOMY;  Surgeon: Rodolph Romano, MD;  Location: [facility];  Service: General;  Laterality: Right;  with needle localization   WISDOM TOOTH EXTRACTION     Patient Active Problem List   Diagnosis Date Noted   Ductal carcinoma in situ (DCIS) of right breast 05/11/2023   Nephrolithiasis 10/04/2022   Hyperlipemia 07/24/2018    PCP: Oneil Pinal, MD  REFERRING PROVIDER: Romano Rodolph, MD  REFERRING DIAG: 197.2  THERAPY DIAG:  Post-mastectomy lymphedema syndrome  Rationale for Evaluation and Treatment: Rehabilitation  ONSET DATE: July 04, 2023  SUBJECTIVE:                                                                                                                                                                                            SUBJECTIVE STATEMENT: Marisa Figueroa returns to OT for lymphedema care to R upper quadrant and RUE. Pt is accompanied by her supportive spouse. Pt reports discomfort in R breast and chest wall are unchanged since her initial evaluation on 08/29/23.   PERTINENT HISTORY:   PAIN:  Are you having pain? Yes: NPRS scale: 4/10 Pain location: R breast Pain  description: swollen, full Aggravating factors:hot weather Relieving factors: compression bra  PRECAUTIONS: Other: LYMPHEDEMA  WEIGHT BEARING RESTRICTIONS: No  FALLS:  Has patient fallen in last 6 months? No  LIVING ENVIRONMENT: Lives with: lives with their spouse Lives in: House/apartment  OCCUPATION: [place]  LEISURE: family  HAND DOMINANCE: left   PRIOR LEVEL OF FUNCTION: Independent  PATIENT GOALS: learn about lymphedema and limit risk of progression   OBJECTIVE: Note: Objective measures were completed at Evaluation unless otherwise noted.  COGNITION:  Overall cognitive status: Within functional limits for tasks assessed   OBSERVATIONS / OTHER ASSESSMENTS:   POSTURE: guarding, R shoulder protracted  UE ROM: full, WNL, with grimacing at end range of shoulder flexion and abduction  LE MMT: WNL for tasks assessed  LYMPHEDEMA ASSESSMENTS:   SURGERY TYPE/DATE: 07/04/23 R lumpectomy  NUMBER OF LYMPH NODES REMOVED: 0 by patient report  CHEMOTHERAPY: none  RADIATION:currently undergoing daily for 6 weeks  HORMONE TREATMENT: none to date  INFECTIONS: post surgical seroma w aspiration x 2   LYMPHEDEMA ASSESSMENTS:    Skin  Description Hyper-Keratosis Peau' de Orange Shiny Tight Fibrotic/ Indurated Fatty Doughy Spongy/ boggy    X R breast  R breast       Skin dry Flaky WNL Macerated   mildly      Color Redness Varicosities Blanching Hemosiderin Stain Mottled   x         Odor Malodorous Yeast Fungal infection  WNL      x   Temperature Warm Cool wnl   x      Pitting Edema   1+ 2+ 3+ 4+ Non-pitting         x   Girth Symmetrical Asymmetrical                   Distribution    x R breast    Stemmer Sign Positive Negative       Lymphorrhea History Of:  Present Absent     x    Wounds History Of Present Absent Venous Arterial Pressure Sheer     x        Signs of Infection Redness Warmth Erythema Acute Swelling Drainage Borders                    Sensation Light Touch Deep pressure Hypersensitivity   In Tact Impaired In Tact Impaired Absent Impaired   x  x  x     Nails WNL   Fungus nail dystrophy   x     Hair Growth Symmetrical Asymmetrical   x    Skin Creases Base of toes  Ankles   Base of Fingers knees       Abdominal pannus Thigh Lobules  Face/neck             INIAL BUE COMPARATIVE LIMB VOLUMETRICS 08/31/23  LANDMARK RIGHT  (Rx)  R ARM (C-G) 2690.2 ml   ml   ml  Limb Volume differential (LVD)  %  Volume change since initial %  Volume change overall V  (Blank rows = not tested)  LANDMARK LEFT (dominant)  L ARM (C-G) 2626.2 ml   ml   ml  Limb Volume differential (LVD)  2.4%, R>L  Volume change since initial %  Volume change overall %  (Blank rows = not tested)    LYMPHEDEMA LIFE IMPACT SCALE (LLIS): NA  TREATMENT THIS DATE:  Baseline comparative limb volumetrics Anatomical measurements for prophylactic RUE compression arm sleeve Pt and family edu, including MLD Multiple printed resources provided to support teaching and practice at home   PATIENT EDUCATION:  Continued Pt/ CG edu for lymphedema self care home program throughout session. Topics include outcome of comparative limb volumetrics- starting limb volume differentials (LVDs), technology and gradient techniques used for short  stretch, multilayer compression wrapping, simple self-MLD, therapeutic lymphatic pumping exercises, skin/nail care, LE precautions, compression garment recommendations and specifications, wear and care schedule and compression garment donning / doffing w assistive devices. Discussed progress towards all OT goals since commencing CDT. Discussed detrimental impact of obesity on lower and upper extremity lymphedema over time. Reviewed OT goals for lymphedema care with Pt and discussed progress to date.  All questions answered to the Pt's satisfaction. Good return. Person educated: Patient  Education method: Explanation, Demonstration, and Handouts Education comprehension: verbalized understanding, returned demonstration, verbal cues required, and needs further education  ASSESSMENT:  CLINICAL IMPRESSION: BLE limb volumetrics reveal limb volume differential measuring 2.4%, R>L, which is WNL. Completed measurements for Off-the-shelf prophylactic compression arm sleeve to be used in keeping with lymphedema precautions. Pt instructed to obtain a Juzo SOFT, size III, regular, ccl 1 sleeve with silicone top band. Internet resource provided. Pt and spouse participated in intro level edu for simple self MLD with good return. Cont as per POC.  (OT INITIAL EVALUATION 08/29/23: Solana Coggin is a 53 y.o. female presenting to Occupational Therapy with mild, stage I, RUE/RUQ lymphedema in the context of post-lumpectomy radiation therapy. Swelling and skin changes are concentrated in the R breast and chest. Although comparative volumetrics are not performed until Rx visit 1, R trunk and arm swelling appear to be absent by gross visual assessment and palpation. Patient is wearing an off-the -shelf-, light duty compression bra that does not constrict lymphatic pathways daily. AROM is WNL, but Pt grimaces at end ranges indicating pain skin stretch. In addition to swelling, Pt c/o R breast/ chest wall soreness and tightness,  which limits basic and instrumental ADLs requiring reaching overhead, including dressing, grooming, putting groceries and dishes in cupboards.   Special Ranes will benefit from skilled Occupational Therapy for lymphedema care. For those undergoing breast radiation, reducing lymphatic overload is important to manage swelling, reduce discomfort, and lower the risk of irreversible progression. OT strategies focus on encouraging proper lymphatic fluid drainage through exercise, manual massage, prophylactic compression, and lifestyle adjustments. In keeping with precautions, MLD will not be performed within the radiation field for at least 8 weeks after completing XRT. We have 6 visits scheduled initially to teach the self care home program so Pt can perform skin care, ther ex, compression therapy and simple self MLD at home with the help of her spouse. OT will support Pt going forward with changes to POC PRN.  OBJECTIVE IMPAIRMENTS: Abnormal gait, decreased activity tolerance, decreased knowledge of condition, decreased knowledge of use of DME, increased edema, pain, and breast swelling.   ACTIVITY LIMITATIONS: lifting, dressing, and hygiene/grooming  PARTICIPATION LIMITATIONS: none  PERSONAL FACTORS: Past/current experiences are also affecting patient's functional outcome.   REHAB POTENTIAL: Good  EVALUATION COMPLEXITY: Low   GOALS: Goals reviewed with patient? Yes  SHORT TERM GOALS: Target date: 6th OT visit  Pt will independent with lymphedema self manual lymphatic drainage (MLD) to limit lymphedema progression. Baseline:dependent Goal status: PROGRESSING  2.  Pt will demonstrate understanding of lymphedema precautions by being able to happy 5 examples  to her daily activities to limit LE progression. Baseline: DEPENDENT Goal status:PROGRESSING   3.  Pt will be able to perform Lymphatic Pumping Therex using correct techniques to limit LE progression. Baseline: DEPENDENT Goal  statusPROGRESSING  4.  Pt will obtain recommended prophylactic compression arm sleeve and wear it in keeping w lymphedema precautions to limit LE progression. Baseline: DEPENDENT Goal status:PROGRESSING  5.  Pt will be able to don lymphedema compression arm sleeve with modified independence ( assistive device) and wear it in compliance with lymphedema precautions to limit LE progression Baseline: DEPENDANT Goal status: PROGRESSING PLAN:  OT FREQUENCY: 1x/week  PT DURATION: 6 weeks and PRN  PLANNED INTERVENTIONS: Teach Pt Lymphedema self-care home program :97110-Therapeutic exercises, 97530- Therapeutic activity, 97140- Manual therapy, Patient/Family education, Manual lymph drainage, and DME instructions  PLAN FOR NEXT SESSION:  Continue teaching simple self-MLD Teach lymphatic pumping ther ex Assess armsleeve fit and function as soon as it is available Zebedee Dec, MS, OTR/L, CLT-LANA 09/03/23 11:33 AM

## 2023-08-31 NOTE — Therapy (Addendum)
 OUTPATIENT OCCUPATIONAL THERAPY EVALUATION  RUE/RUQ Post-Mastectomy Lymphedema  Patient Name: Marisa Figueroa MRN: [id number] DOB:August 18, 1970, 53 y.o., female Today's Date: 08/31/2023  END OF SESSION:   Past Medical History:  Diagnosis Date   Ductal carcinoma in situ (DCIS) of right breast    History of kidney stones    Hyperlipidemia    Past Surgical History:  Procedure Laterality Date   BREAST BIOPSY Right 04/19/2023   MM RT BREAST BX W LOC DEV 1ST LESION IMAGE BX SPEC STEREO GUIDE 04/19/2023 [facility]   BREAST BIOPSY Right 04/19/2023   MM RT BREAST BX W LOC DEV EA AD LESION IMG BX SPEC STEREO GUIDE 04/19/2023 [facility]   BREAST BIOPSY Right 06/20/2023   MM RT PLC BREAST LOC DEV   1ST LESION  INC MAMMO GUIDE 06/20/2023 [facility]   BREAST BIOPSY Right 07/04/2023   MM RT PLC BREAST LOC DEV   1ST LESION  INC STEREO GUIDE 07/04/2023 [facility]   BREAST LUMPECTOMY Right 07/04/2023   Procedure: BREAST LUMPECTOMY;  Surgeon: Rodolph Romano, MD;  Location: [facility];  Service: General;  Laterality: Right;  with needle localization   WISDOM TOOTH EXTRACTION     Patient Active Problem List   Diagnosis Date Noted   Ductal carcinoma in situ (DCIS) of right breast 05/11/2023   Nephrolithiasis 10/04/2022   Hyperlipemia 07/24/2018    PCP: Oneil Pinal, MD  REFERRING PROVIDER: Romano Rodolph, MD  REFERRING DIAG: 197.2  THERAPY DIAG:  Post-mastectomy lymphedema syndrome  Rationale for Evaluation and Treatment: Rehabilitation  ONSET DATE: July 04, 2023  SUBJECTIVE:                                                                                                                                                                                           SUBJECTIVE STATEMENT:  PERTINENT HISTORY: r BREAST Biopsy 04/19/23; r BREAST BIOPSY 06/20/23; r BREAST BIOPSY AND LUMPECTOMY 07/04/23; Reports post surgical R breast infection w seroma; 2 x seroma expiration; No  LND, No chemo, currently undergoing XRT w 16 Rx remaining- by report.  PAIN:  Are you having pain? Yes: NPRS scale: 4/10 Pain location: R breast Pain description: swollen, full Aggravating factors:hot weather Relieving factors: TBA  PRECAUTIONS: Other: LYMPHEDEMA- RUE/RUQ  WEIGHT BEARING RESTRICTIONS: No  FALLS:  Has patient fallen in last 6 months? No  LIVING ENVIRONMENT: Lives with: lives with their spouse Lives in: House/apartment  OCCUPATION: [place]  LEISURE: family  HAND DOMINANCE: left   PRIOR LEVEL OF FUNCTION: Independent  PATIENT GOALS: learn about lymphedema and limit risk of progression   OBJECTIVE: Note: Objective measures were completed at Evaluation unless otherwise noted.  COGNITION:  Overall cognitive status: Within functional limits for tasks assessed   OBSERVATIONS / OTHER ASSESSMENTS:   POSTURE: guarding, R shoulder protraction  UE ROM: full, WNL  LE MMT: WNL  LYMPHEDEMA ASSESSMENTS:   SURGERY TYPE/DATE: 07/04/23 R lumpectomy  NUMBER OF LYMPH NODES REMOVED: 0 by patient report  CHEMOTHERAPY: none  RADIATION:currently undergoing daily for 6 weeks  HORMONE TREATMENT: none to date  INFECTIONS: post surgical seroma   LYMPHEDEMA ASSESSMENT: Mild, fluctuating, stage 1, RUE/RUQ post lumpectomy lymphedema   Skin  Description Hyper-Keratosis Peau' de Orange Shiny Tight Fibrotic/ Indurated Fatty Doughy Spongy/ boggy Hyperplasia     x  x Mild 2/2 XRT       Skin dry Flaky WNL Macerated   mildly      Color Redness Varicosities Blanching Hemosiderin Stain Mottled   x     x   Odor Malodorous Yeast Fungal infection  WNL      x   Temperature Warm Cool wnl   x      Pitting Edema   1+ 2+ 3+ 4+ Non-pitting         x   Girth Symmetrical Asymmetrical                   Distribution    x R breast    Stemmer Sign Positive Negative    x   Lymphorrhea History Of:  Present Absent     x    Wounds History Of Present Absent Venous  Arterial Pressure Sheer     x        Signs of Infection Redness Warmth Erythema Acute Swelling Drainage Borders                    Sensation Light Touch Deep pressure Hypersensitivity   In Tact Impaired In Tact Impaired Absent Impaired   x  x   R breast sensitive to light palpation     Nails WNL   Fungus nail dystrophy   x     Hair Growth Symmetrical Asymmetrical   x    Skin Creases Base of toes  Ankles   Base of Fingers knees       Abdominal pannus Thigh Lobules  Face/neck             BUE COMPARATIVE LIMB VOLUMETRICS TBA OT Rx 1  LANDMARK RIGHT  (Rx)  R ARM (C-G) N/A   ml   ml  Limb Volume differential (LVD)  %  Volume change since initial %  Volume change overall V  (Blank rows = not tested)  LANDMARK LEFT (dominant)  L ARM (C-G) N/A   ml   ml  Limb Volume differential (LVD)  %  Volume change since initial %  Volume change overall %  (Blank rows = not tested)    LYMPHEDEMA LIFE IMPACT SCALE (LLIS): NA  TREATMENT THIS DATE:  OT Initial evaluation- lymphedema Pt and family edu   PATIENT EDUCATION:  Discussed differential diagnoses for various swelling disorders. Provided basic level education regarding lymphatic structure and function, etiology, onset patterns, stages of progression, and prevention to limit infection risk, worsening condition and further functional decline. Pt edu for aught interaction between blood circulatory system and lymphatic circulation.Discussed  impact of gravity and co-morbidities on lymphatic function. Outlined Complete Decongestive Therapy (CDT)  as standard of care and provided in depth information regarding 4 primary components of Intensive and Self Management Phases, including Manual Lymph Drainage (MLD), compression wrapping and garments, skin care, and therapeutic exercise. Marisa Figueroa discussion with re need for  frequent attendance and high burden of care when caregiver is needed, impact of co morbidities. We discussed  the chronic, progressive nature of lymphedema and Importance of daily, ongoing LE self-care essential for limiting progression and infection risk.  Person educated: Patient and spouse Education method: Explanation, Demonstration, and Handouts Education comprehension: verbalized understanding, returned demonstration, verbal cues required, and needs further education  ASSESSMENT:  CLINICAL IMPRESSION: Marisa Figueroa is a 53 y.o. female presenting to Occupational Therapy with mild, stage I, RUE/RUQ lymphedema in the context of post-lumpectomy radiation therapy. Swelling and skin changes are concentrated in the R breast and chest. Although comparative volumetrics are not performed until Rx visit 1, R trunk and arm swelling appear to be absent by gross visual assessment and palpation. Patient is wearing an off-the -shelf-, light duty compression bra that does not constrict lymphatic pathways daily. AROM is WNL, but Pt grimaces at end ranges indicating pain skin stretch. In addition to swelling, Pt c/o R breast/ chest wall soreness and tightness, which limits basic and instrumental ADLs requiring reaching overhead, including dressing, grooming, putting groceries and dishes in cupboards.   Pt will benefit from skilled OT to reduce the lymphatic load of water  while undergoing XRT. Reducing lymphatic load is important for managing swelling, reducing pain/ discomfort, and lowering the risk of lymphedema progression. OT strategies will focus on encouraging proper lymphatic drainage through exercise, manual lymphatic drainage (MLD outside the radiation field for at least 6-8 weeks after completing XRT), compression, lifestyle adjustments, and Pt and family edu for lymphedema self-care home program, including self MLD, skin care, fitting prophylactic compression sleeve and incorporating ther ex into regular  exercise routine.  Marisa Figueroa will benefit from skilled Occupational Therapy for lymphedema care. For those undergoing breast radiation, reducing lymphatic load is important to manage swelling, reduce discomfort, and lower the risk of irreversible progression. OT strategies focus on encouraging proper lymphatic fluid drainage through exercise, manual massage, prophylactic compression, and lifestyle adjustments. We have 6 visits scheduled initially to teach the self care protocol so Pt can perform skin care, ther ex, compression therapy and simple self MLD at home with the help of her spouse. OT will support Pt going forward with changes to POC PRN.  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   REHAB POTENTIAL: Good  EVALUATION COMPLEXITY: Low   GOALS: Goals reviewed with patient? Yes  SHORT TERM GOALS: Target date: 6th OT visit  Pt will  independent with lymphedema self manual lymphatic drainage (MLD) to limit lymphedema progression. Baseline:dependent Goal status: INITIAL  2.  Pt will demonstrate understanding of lymphedema precautions by being able to happy 5 examples to her daily activities to limit LE progression. Baseline: DEPENDENT Goal status: INITIAL  3.  Pt will be able to perform Lymphatic Pumping Therex using correct techniques to limit LE progression. Baseline: DEPENDENT Goal status: INITIAL  4.  Pt will obtain recommended prophylactic compression arm sleeve and wear it in  keeping w lymphedema precautions to limit LE progression. Baseline: DEPENDENT Goal status: INITIAL  5.  Pt will be able to don lymphedema compression arm sleeve with modified independence ( assistive device) and wear it in compliance with lymphedema precautions to limit LE progression Baseline: DEPENDANT Goal status: INITIAL PLAN:  OT FREQUENCY: 1x/week  PT DURATION: 6 weeks and PRN   PLANNED INTERVENTIONS: Teach Pt Lymphedema self-care home program :97110-Therapeutic exercises, 97530- Therapeutic activity, 97140- Manual therapy, Patient/Family education, Manual lymph drainage, and DME instructions  PLAN FOR NEXT SESSION: baseline volumetrics, commence teaching simple self MLD, arm sleeve measurements  Marisa Dec, MS, OTR/L, CLT-LANA 08/31/23 1:42 PM

## 2023-09-01 DIAGNOSIS — Z510 Encounter for antineoplastic radiation therapy: Secondary | ICD-10-CM | POA: Diagnosis not present

## 2023-09-03 ENCOUNTER — Other Ambulatory Visit: Payer: Self-pay

## 2023-09-03 ENCOUNTER — Inpatient Hospital Stay

## 2023-09-03 ENCOUNTER — Ambulatory Visit
Admission: RE | Admit: 2023-09-03 | Discharge: 2023-09-03 | Disposition: A | Discharge status: Home / Self Care | Source: Ambulatory Visit | Attending: Radiation Oncology | Admitting: Radiation Oncology

## 2023-09-03 ENCOUNTER — Telehealth: Payer: Self-pay

## 2023-09-03 DIAGNOSIS — Z510 Encounter for antineoplastic radiation therapy: Secondary | ICD-10-CM | POA: Diagnosis not present

## 2023-09-03 LAB — RAD ONC ARIA SESSION SUMMARY
Course Elapsed Days: 20
Plan Fractions Treated to Date: 14
Plan Prescribed Dose Per Fraction: 2.66 Gy
Plan Total Fractions Prescribed: 16
Plan Total Prescribed Dose: 42.56 Gy
Reference Point Dosage Given to Date: 37.24 Gy
Reference Point Session Dosage Given: 2.66 Gy
Session Number: 14

## 2023-09-03 NOTE — Addendum Note (Signed)
 Addended by: MYRIAM ZEBEDEE CROME on: 09/03/2023 11:31 AM   Modules accepted: Orders

## 2023-09-03 NOTE — Telephone Encounter (Signed)
 Clinical Social Work   CSW attempted to contact patient by phone to assess needs.  Left voicemail with contact information and request for return call.

## 2023-09-04 ENCOUNTER — Other Ambulatory Visit: Payer: Self-pay

## 2023-09-04 ENCOUNTER — Ambulatory Visit
Admission: RE | Admit: 2023-09-04 | Discharge: 2023-09-04 | Disposition: A | Discharge status: Home / Self Care | Source: Ambulatory Visit | Attending: Radiation Oncology | Admitting: Radiation Oncology

## 2023-09-04 ENCOUNTER — Ambulatory Visit

## 2023-09-04 DIAGNOSIS — Z510 Encounter for antineoplastic radiation therapy: Secondary | ICD-10-CM | POA: Diagnosis not present

## 2023-09-04 LAB — RAD ONC ARIA SESSION SUMMARY
Course Elapsed Days: 21
Plan Fractions Treated to Date: 15
Plan Prescribed Dose Per Fraction: 2.66 Gy
Plan Total Fractions Prescribed: 16
Plan Total Prescribed Dose: 42.56 Gy
Reference Point Dosage Given to Date: 39.9 Gy
Reference Point Session Dosage Given: 2.66 Gy
Session Number: 15

## 2023-09-05 ENCOUNTER — Ambulatory Visit
Admission: RE | Admit: 2023-09-05 | Discharge: 2023-09-05 | Disposition: A | Discharge status: Home / Self Care | Source: Ambulatory Visit | Attending: Radiation Oncology | Admitting: Radiation Oncology

## 2023-09-05 ENCOUNTER — Other Ambulatory Visit: Payer: Self-pay

## 2023-09-05 DIAGNOSIS — Z510 Encounter for antineoplastic radiation therapy: Secondary | ICD-10-CM | POA: Diagnosis not present

## 2023-09-05 LAB — RAD ONC ARIA SESSION SUMMARY
Course Elapsed Days: 22
Plan Fractions Treated to Date: 16
Plan Prescribed Dose Per Fraction: 2.66 Gy
Plan Total Fractions Prescribed: 16
Plan Total Prescribed Dose: 42.56 Gy
Reference Point Dosage Given to Date: 42.56 Gy
Reference Point Session Dosage Given: 2.66 Gy
Session Number: 16

## 2023-09-06 ENCOUNTER — Other Ambulatory Visit: Payer: Self-pay

## 2023-09-06 ENCOUNTER — Ambulatory Visit
Admission: RE | Admit: 2023-09-06 | Discharge: 2023-09-06 | Disposition: A | Discharge status: Home / Self Care | Source: Ambulatory Visit | Attending: Radiation Oncology | Admitting: Radiation Oncology

## 2023-09-06 DIAGNOSIS — Z510 Encounter for antineoplastic radiation therapy: Secondary | ICD-10-CM | POA: Diagnosis not present

## 2023-09-06 LAB — RAD ONC ARIA SESSION SUMMARY
Course Elapsed Days: 23
Plan Fractions Treated to Date: 1
Plan Prescribed Dose Per Fraction: 2 Gy
Plan Total Fractions Prescribed: 8
Plan Total Prescribed Dose: 16 Gy
Reference Point Dosage Given to Date: 2 Gy
Reference Point Session Dosage Given: 2 Gy
Session Number: 17

## 2023-09-07 ENCOUNTER — Other Ambulatory Visit: Payer: Self-pay

## 2023-09-07 ENCOUNTER — Ambulatory Visit
Admission: RE | Admit: 2023-09-07 | Discharge: 2023-09-07 | Disposition: A | Discharge status: Home / Self Care | Source: Ambulatory Visit | Attending: Radiation Oncology | Admitting: Radiation Oncology

## 2023-09-07 DIAGNOSIS — Z510 Encounter for antineoplastic radiation therapy: Secondary | ICD-10-CM | POA: Diagnosis not present

## 2023-09-07 LAB — RAD ONC ARIA SESSION SUMMARY
Course Elapsed Days: 24
Plan Fractions Treated to Date: 2
Plan Prescribed Dose Per Fraction: 2 Gy
Plan Total Fractions Prescribed: 8
Plan Total Prescribed Dose: 16 Gy
Reference Point Dosage Given to Date: 4 Gy
Reference Point Session Dosage Given: 2 Gy
Session Number: 18

## 2023-09-11 ENCOUNTER — Ambulatory Visit: Admitting: Occupational Therapy

## 2023-09-11 ENCOUNTER — Other Ambulatory Visit: Payer: Self-pay

## 2023-09-11 ENCOUNTER — Ambulatory Visit
Admission: RE | Admit: 2023-09-11 | Discharge: 2023-09-11 | Disposition: A | Discharge status: Home / Self Care | Source: Ambulatory Visit | Attending: Radiation Oncology | Admitting: Radiation Oncology

## 2023-09-11 DIAGNOSIS — Z510 Encounter for antineoplastic radiation therapy: Secondary | ICD-10-CM | POA: Diagnosis present

## 2023-09-11 DIAGNOSIS — F17210 Nicotine dependence, cigarettes, uncomplicated: Secondary | ICD-10-CM | POA: Insufficient documentation

## 2023-09-11 DIAGNOSIS — Z170 Estrogen receptor positive status [ER+]: Secondary | ICD-10-CM | POA: Insufficient documentation

## 2023-09-11 DIAGNOSIS — D0511 Intraductal carcinoma in situ of right breast: Secondary | ICD-10-CM | POA: Diagnosis present

## 2023-09-11 LAB — RAD ONC ARIA SESSION SUMMARY
Course Elapsed Days: 28
Plan Fractions Treated to Date: 3
Plan Prescribed Dose Per Fraction: 2 Gy
Plan Total Fractions Prescribed: 8
Plan Total Prescribed Dose: 16 Gy
Reference Point Dosage Given to Date: 6 Gy
Reference Point Session Dosage Given: 2 Gy
Session Number: 19

## 2023-09-12 ENCOUNTER — Ambulatory Visit
Admission: RE | Admit: 2023-09-12 | Discharge: 2023-09-12 | Disposition: A | Discharge status: Home / Self Care | Source: Ambulatory Visit | Attending: Radiation Oncology | Admitting: Radiation Oncology

## 2023-09-12 ENCOUNTER — Other Ambulatory Visit: Payer: Self-pay

## 2023-09-12 DIAGNOSIS — Z510 Encounter for antineoplastic radiation therapy: Secondary | ICD-10-CM | POA: Diagnosis not present

## 2023-09-12 LAB — RAD ONC ARIA SESSION SUMMARY
Course Elapsed Days: 29
Plan Fractions Treated to Date: 4
Plan Prescribed Dose Per Fraction: 2 Gy
Plan Total Fractions Prescribed: 8
Plan Total Prescribed Dose: 16 Gy
Reference Point Dosage Given to Date: 8 Gy
Reference Point Session Dosage Given: 2 Gy
Session Number: 20

## 2023-09-13 ENCOUNTER — Ambulatory Visit
Admission: RE | Admit: 2023-09-13 | Discharge: 2023-09-13 | Disposition: A | Discharge status: Home / Self Care | Source: Ambulatory Visit | Attending: Radiation Oncology | Admitting: Radiation Oncology

## 2023-09-13 ENCOUNTER — Encounter: Admitting: Occupational Therapy

## 2023-09-13 ENCOUNTER — Other Ambulatory Visit: Payer: Self-pay

## 2023-09-13 DIAGNOSIS — Z510 Encounter for antineoplastic radiation therapy: Secondary | ICD-10-CM | POA: Diagnosis not present

## 2023-09-13 LAB — RAD ONC ARIA SESSION SUMMARY
Course Elapsed Days: 30
Plan Fractions Treated to Date: 5
Plan Prescribed Dose Per Fraction: 2 Gy
Plan Total Fractions Prescribed: 8
Plan Total Prescribed Dose: 16 Gy
Reference Point Dosage Given to Date: 10 Gy
Reference Point Session Dosage Given: 2 Gy
Session Number: 21

## 2023-09-14 ENCOUNTER — Ambulatory Visit
Admission: RE | Admit: 2023-09-14 | Discharge: 2023-09-14 | Disposition: A | Discharge status: Home / Self Care | Source: Ambulatory Visit | Attending: Radiation Oncology | Admitting: Radiation Oncology

## 2023-09-14 ENCOUNTER — Other Ambulatory Visit: Payer: Self-pay

## 2023-09-14 DIAGNOSIS — Z510 Encounter for antineoplastic radiation therapy: Secondary | ICD-10-CM | POA: Diagnosis not present

## 2023-09-14 LAB — RAD ONC ARIA SESSION SUMMARY
Course Elapsed Days: 31
Plan Fractions Treated to Date: 6
Plan Prescribed Dose Per Fraction: 2 Gy
Plan Total Fractions Prescribed: 8
Plan Total Prescribed Dose: 16 Gy
Reference Point Dosage Given to Date: 12 Gy
Reference Point Session Dosage Given: 2 Gy
Session Number: 22

## 2023-09-17 ENCOUNTER — Inpatient Hospital Stay: Admitting: Oncology

## 2023-09-17 ENCOUNTER — Other Ambulatory Visit: Payer: Self-pay

## 2023-09-17 ENCOUNTER — Ambulatory Visit: Attending: General Surgery | Admitting: Occupational Therapy

## 2023-09-17 ENCOUNTER — Ambulatory Visit
Admission: RE | Admit: 2023-09-17 | Discharge: 2023-09-17 | Disposition: A | Discharge status: Home / Self Care | Source: Ambulatory Visit | Attending: Radiation Oncology | Admitting: Radiation Oncology

## 2023-09-17 DIAGNOSIS — Z510 Encounter for antineoplastic radiation therapy: Secondary | ICD-10-CM | POA: Diagnosis not present

## 2023-09-17 LAB — RAD ONC ARIA SESSION SUMMARY
Course Elapsed Days: 34
Plan Fractions Treated to Date: 7
Plan Prescribed Dose Per Fraction: 2 Gy
Plan Total Fractions Prescribed: 8
Plan Total Prescribed Dose: 16 Gy
Reference Point Dosage Given to Date: 14 Gy
Reference Point Session Dosage Given: 2 Gy
Session Number: 23

## 2023-09-18 ENCOUNTER — Encounter: Payer: Self-pay | Admitting: Oncology

## 2023-09-18 ENCOUNTER — Encounter: Payer: Self-pay | Admitting: *Deleted

## 2023-09-18 ENCOUNTER — Other Ambulatory Visit: Payer: Self-pay

## 2023-09-18 ENCOUNTER — Inpatient Hospital Stay: Attending: Oncology | Admitting: Oncology

## 2023-09-18 ENCOUNTER — Ambulatory Visit
Admission: RE | Admit: 2023-09-18 | Discharge: 2023-09-18 | Disposition: A | Discharge status: Home / Self Care | Source: Ambulatory Visit | Attending: Radiation Oncology | Admitting: Radiation Oncology

## 2023-09-18 VITALS — BP 128/94 | HR 71 | Temp 97.6°F | Resp 16 | Wt 165.0 lb

## 2023-09-18 DIAGNOSIS — Z510 Encounter for antineoplastic radiation therapy: Secondary | ICD-10-CM | POA: Diagnosis not present

## 2023-09-18 DIAGNOSIS — D0511 Intraductal carcinoma in situ of right breast: Secondary | ICD-10-CM | POA: Diagnosis not present

## 2023-09-18 LAB — RAD ONC ARIA SESSION SUMMARY
Course Elapsed Days: 35
Plan Fractions Treated to Date: 8
Plan Prescribed Dose Per Fraction: 2 Gy
Plan Total Fractions Prescribed: 8
Plan Total Prescribed Dose: 16 Gy
Reference Point Dosage Given to Date: 16 Gy
Reference Point Session Dosage Given: 2 Gy
Session Number: 24

## 2023-09-18 MED ORDER — TAMOXIFEN CITRATE 20 MG PO TABS: 20.0000 mg | ORAL_TABLET | Freq: Every day | ORAL | 0 refills | Status: DC

## 2023-09-18 NOTE — Progress Notes (Signed)
 [facility]  Telephone:([phone]) [phone] Fax:([phone]) [phone]  ID: Marisa Figueroa OB: [birth year]  MR#: [id number]  RDW#:[id number]  Patient Care Team: Cleotilde Oneil FALCON, MD as PCP - General (Internal Medicine) Georgina Shasta POUR, RN as Oncology Nurse Navigator Jacobo, Evalene PARAS, MD as Consulting Physician (Oncology)  CHIEF COMPLAINT: DCIS right breast.  INTERVAL HISTORY: Patient returns to clinic today for further evaluation at the conclusion of her XRT.  She has mild breast tenderness, but otherwise feels well.  She has no neurologic complaints.  She denies any recent fevers or illnesses.  She has a good appetite and denies weight loss.  She has no chest pain, shortness of breath, cough, or hemoptysis.  She denies any nausea, vomiting, constipation, or diarrhea.  She has no urinary complaints.  Patient offers no further specific complaints today.  REVIEW OF SYSTEMS:   Review of Systems  Constitutional: Negative.  Negative for fever, malaise/fatigue and weight loss.  Respiratory: Negative.  Negative for cough, hemoptysis and shortness of breath.   Cardiovascular: Negative.  Negative for chest pain and leg swelling.  Gastrointestinal: Negative.  Negative for abdominal pain.  Genitourinary: Negative.  Negative for dysuria.  Musculoskeletal: Negative.  Negative for back pain.  Skin: Negative.  Negative for rash.  Neurological: Negative.  Negative for dizziness, focal weakness, weakness and headaches.  Psychiatric/Behavioral: Negative.  The patient is not nervous/anxious.     As per HPI. Otherwise, a complete review of systems is negative.  PAST MEDICAL HISTORY: Past Medical History:  Diagnosis Date   Ductal carcinoma in situ (DCIS) of right breast    History of kidney stones    Hyperlipidemia     PAST SURGICAL HISTORY: Past Surgical History:  Procedure Laterality Date   BREAST BIOPSY Right 04/19/2023   MM RT BREAST BX W LOC DEV 1ST LESION IMAGE BX SPEC STEREO GUIDE  04/19/2023 [facility]   BREAST BIOPSY Right 04/19/2023   MM RT BREAST BX W LOC DEV EA AD LESION IMG BX SPEC STEREO GUIDE 04/19/2023 [facility]   BREAST BIOPSY Right 06/20/2023   MM RT PLC BREAST LOC DEV   1ST LESION  INC MAMMO GUIDE 06/20/2023 [facility]   BREAST BIOPSY Right 07/04/2023   MM RT PLC BREAST LOC DEV   1ST LESION  INC STEREO GUIDE 07/04/2023 [facility]   BREAST LUMPECTOMY Right 07/04/2023   Procedure: BREAST LUMPECTOMY;  Surgeon: Rodolph Romano, MD;  Location: [facility];  Service: General;  Laterality: Right;  with needle localization   WISDOM TOOTH EXTRACTION      FAMILY HISTORY: Family History  Problem Relation Age of Onset   Melanoma Mother        metastatic   Prostate cancer Father    Ovarian cancer Paternal Aunt    ALS Paternal Grandfather     ADVANCED DIRECTIVES (Y/N):  N  HEALTH MAINTENANCE: Social History   Tobacco Use   Smoking status: Light Smoker    Current packs/day: 0.14    Average packs/day: 0.1 packs/day for 20.0 years (2.8 ttl pk-yrs)    Types: Cigarettes   Smokeless tobacco: Never  Vaping Use   Vaping status: Never Used  Substance Use Topics   Alcohol use: Yes    Comment: social   Drug use: Never     Colonoscopy:  PAP:  Bone density:  Lipid panel:  Allergies  Allergen Reactions   Lactose Intolerance (Gi)     Upsets stomach   Penicillins Itching   Simvastatin Other (See Comments)    Unknown reaction  Sulfa  Antibiotics    Epinephrine  Palpitations    Current Outpatient Medications  Medication Sig Dispense Refill   acetaminophen  (TYLENOL ) 500 MG tablet Take 500 mg by mouth every 6 (six) hours as needed for moderate pain (pain score 4-6).     albuterol (VENTOLIN HFA) 108 (90 Base) MCG/ACT inhaler Inhale 1-2 puffs into the lungs every 6 (six) hours as needed for wheezing or shortness of breath (FOR ALLERGIES ONLY).     Coenzyme Q10 (CO Q 10 PO) Take 1 tablet by mouth daily at 6 (six) AM.     cyanocobalamin  (VITAMIN B12) 1000 MCG tablet Take 1,000 mcg by mouth daily.     diphenhydrAMINE (BENADRYL) 25 MG tablet Take 25 mg by mouth every 6 (six) hours as needed for allergies.     ivermectin (STROMECTOL) 3 MG TABS tablet Take 150 mcg/kg by mouth.     ketoconazole (NIZORAL) 2 % shampoo Apply 1 Application topically once a week.     Moringa Oleifera (MORINGA PO) Take 2 tablets by mouth daily.     Multiple Vitamin (MULTIVITAMIN WITH MINERALS) TABS tablet Take 1 tablet by mouth daily.     OVER THE COUNTER MEDICATION Take 4 drops by mouth daily. Silver water      oxyCODONE -acetaminophen  (PERCOCET) 5-325 MG tablet Take 1 tablet by mouth every 4 (four) hours as needed for severe pain (pain score 7-10). 20 tablet 0   rosuvastatin (CRESTOR) 10 MG tablet Take 10 mg by mouth at bedtime.     Vitamin D-Vitamin K (VITAMIN K2-VITAMIN D3 PO) Take 1 capsule by mouth daily.     VITAMIN E PO Take 1 tablet by mouth daily.     No current facility-administered medications for this visit.    OBJECTIVE: Vitals:   09/18/23 0925  BP: (!) 128/94  Pulse: 71  Resp: 16  Temp: 97.6 F (36.4 C)  SpO2: 97%     Body mass index is 32.22 kg/m.    ECOG FS:0 - Asymptomatic  General: Well-developed, well-nourished, no acute distress. Eyes: Pink conjunctiva, anicteric sclera. HEENT: Normocephalic, moist mucous membranes. Lungs: No audible wheezing or coughing. Heart: Regular rate and rhythm. Abdomen: Soft, nontender, no obvious distention. Musculoskeletal: No edema, cyanosis, or clubbing. Neuro: Alert, answering all questions appropriately. Cranial nerves grossly intact. Skin: No rashes or petechiae noted. Psych: Normal affect.  LAB RESULTS:  Lab Results  Component Value Date   NA 136 08/05/2022   K 3.4 (L) 08/05/2022   CL 104 08/05/2022   CO2 21 (L) 08/05/2022   GLUCOSE 108 (H) 08/05/2022   BUN 14 08/05/2022   CREATININE 0.75 08/05/2022   CALCIUM 9.1 08/05/2022   PROT 7.5 08/05/2022   ALBUMIN 4.6 08/05/2022    AST 21 08/05/2022   ALT 15 08/05/2022   ALKPHOS 61 08/05/2022   BILITOT 0.6 08/05/2022   GFRNONAA >60 08/05/2022    Lab Results  Component Value Date   WBC 6.6 08/20/2023   HGB 13.9 08/20/2023   HCT 40.4 08/20/2023   MCV 85.1 08/20/2023   PLT 255 08/20/2023     STUDIES: No results found.   ASSESSMENT: DCIS right breast.  PLAN:    DCIS right breast: Imaging and pathology reviewed independently.  Patient also had a breast MRI on May 18, 2023.  No pathologically enlarged lymph nodes were noted.  Lumpectomy completed on July 04, 2023 confirmed diagnosis.  Patient did not require adjuvant chemotherapy.  She completed adjuvant XRT on September 18, 2023.  She declined initiating tamoxifen  at  this time despite acknowledging the increased risk of recurrence.  No further intervention is needed.  Patient return to clinic in 3 months for further evaluation.  If she continues to refuse tamoxifen  at that time, she likely can be discharged from clinic.   Left breast cellulitis: Resolved.  I spent a total of 30 minutes reviewing chart data, face-to-face evaluation with the patient, counseling and coordination of care as detailed above.  Patient expressed understanding and was in agreement with this plan. She also understands that She can call clinic at any time with any questions, concerns, or complaints.    Cancer Staging  Ductal carcinoma in situ (DCIS) of right breast Staging form: Breast, AJCC 8th Edition - Clinical stage from 05/23/2023: Stage 0 (cTis (DCIS), cN0, cM0, G2, ER+, PR+, HER2-) - Signed by Jacobo Evalene PARAS, MD on 05/23/2023 Stage prefix: Initial diagnosis Histologic grading system: 3 grade system   Evalene PARAS Jacobo, MD   09/18/2023 1:58 PM

## 2023-09-19 ENCOUNTER — Encounter: Payer: Self-pay | Admitting: *Deleted

## 2023-09-19 NOTE — Progress Notes (Signed)
 Called Ms. Daniely per Deland to see if she is available on 9/24 to come to clinic for evaluation.   Ms. Peffley will call me back with her availability.

## 2023-09-19 NOTE — Radiation Completion Notes (Signed)
 Patient Name: Marisa Figueroa, Marisa Figueroa MRN: [id number] Date of Birth: June 30, 1970 Referring Physician: ONEIL PINAL, M.D. Date of Service: 2023-09-19 Radiation Oncologist: Marcey Penton, M.D. [facility] [place]                             RADIATION ONCOLOGY END OF TREATMENT NOTE     Diagnosis: D05.11 Intraductal carcinoma in situ of right breast Staging on 2023-05-23: Ductal carcinoma in situ (DCIS) of right breast T=cTis (DCIS), N=cN0, M=cM0 Intent: Curative     HPI: Patient is a 53 year old female who presented with an abnormal mammogram.  There were 2 adjacent groups of calcifications in the upper outer right breast posterior depth low suspicious for malignancy although stereotactic guided biopsy was performed.  She underwent targeted biopsy which showed ductal carcinoma in situ ER positive.  MRI of the breast showed 3.5 cm of DCIS within the upper outer quadrant no other suspicious abnormalities of either breast.  No suspicious appearing lymph nodes were noted.  She went on to have a wide local excision for at least 60 mm of grade 2 ductal carcinoma in situ.  Margins were clear but close at 1 mm specifically the anterior and superior margin.  No lymph nodes were submitted.  Tumor was again ER positive.  Postoperatively she did have some fluid accumulation at the surgical site which did turn into what I believe is mastitis for which she was treated with appropriately antibiotics.  She also had her seroma drained.  Surgeon thought even with the 1 mm margin due to the final pathology showing intermediate grade DCIS it would be fine to proceed with radiation which I agree with.  She is seen today for radiation oncology opinion.  She has been seen by medical oncology with recommendation for endocrine therapy after completion of radiation.  Breast is much improved now with almost no signs of mastitis still slight erythematous hue to the skin.  No warmth noted.  Mild seroma still  appreciated.      ==========DELIVERED PLANS==========  First Treatment Date: 2023-08-14 Last Treatment Date: 2023-09-18   Plan Name: Breast_R Site: Breast, Right Technique: 3D Mode: Photon Dose Per Fraction: 2.66 Gy Prescribed Dose (Delivered / Prescribed): 42.56 Gy / 42.56 Gy Prescribed Fxs (Delivered / Prescribed): 16 / 16   Plan Name: Breast_R_Bst Site: Breast, Right Technique: 3D Mode: Photon Dose Per Fraction: 2 Gy Prescribed Dose (Delivered / Prescribed): 16 Gy / 16 Gy Prescribed Fxs (Delivered / Prescribed): 8 / 8     ==========ON TREATMENT VISIT DATES========== 2023-08-14, 2023-08-28, 2023-09-04, 2023-09-11, 2023-09-18     ==========UPCOMING VISITS========== 12/18/2023 [facility] ONC EST PT Jacobo Evalene PARAS, MD  10/18/2023 [facility] RAD ONCOLOGY FOLLOW UP 30 Chrystal, Marcey, MD        ==========APPENDIX - ON TREATMENT VISIT NOTES==========   See weekly On Treatment Notes in Epic for details in the Media tab (listed as Progress notes on the On Treatment Visit Dates listed above).

## 2023-10-11 ENCOUNTER — Other Ambulatory Visit: Payer: Self-pay | Admitting: Internal Medicine

## 2023-10-11 DIAGNOSIS — Z0000 Encounter for general adult medical examination without abnormal findings: Secondary | ICD-10-CM

## 2023-10-11 DIAGNOSIS — E782 Mixed hyperlipidemia: Secondary | ICD-10-CM

## 2023-10-16 ENCOUNTER — Ambulatory Visit
Admission: RE | Admit: 2023-10-16 | Discharge: 2023-10-16 | Disposition: A | Discharge status: Home / Self Care | Payer: Self-pay | Source: Ambulatory Visit | Attending: Internal Medicine | Admitting: Internal Medicine

## 2023-10-16 DIAGNOSIS — E782 Mixed hyperlipidemia: Secondary | ICD-10-CM | POA: Insufficient documentation

## 2023-10-16 DIAGNOSIS — Z0000 Encounter for general adult medical examination without abnormal findings: Secondary | ICD-10-CM | POA: Insufficient documentation

## 2023-10-18 ENCOUNTER — Encounter: Payer: Self-pay | Admitting: Radiation Oncology

## 2023-10-18 ENCOUNTER — Ambulatory Visit: Admission: RE | Admit: 2023-10-18 | Discharge: 2023-10-18 | Attending: Oncology | Admitting: Oncology

## 2023-10-18 VITALS — BP 137/89 | HR 70 | Temp 98.0°F | Resp 16 | Ht 59.0 in | Wt 161.0 lb

## 2023-10-18 DIAGNOSIS — D0511 Intraductal carcinoma in situ of right breast: Secondary | ICD-10-CM | POA: Diagnosis present

## 2023-10-18 NOTE — Progress Notes (Signed)
 Radiation Oncology Follow up Note  Name: Marisa Figueroa   Date:   10/18/2023 MRN:  [id number] DOB: [birth year]    This 53 y.o. female presents to the clinic today for 1 month follow-up status post whole breast radiation to her right breast for ER-positive ductal carcinoma in situ stage 0.  REFERRING PROVIDER: Cleotilde Oneil FALCON, MD  HPI: Patient is a 53 year old female now out 1 month having completed whole breast radiation to her right breast for ER positive ductal carcinoma in situ.  Seen today in routine follow-up she is doing well.  She does complain of some fatigue.  She specifically denies breast tenderness cough or bone pain.  She has had her breast aspirated once of the seroma.  She otherwise is without complaint.  She has declined endocrine therapy..  COMPLICATIONS OF TREATMENT: none  FOLLOW UP COMPLIANCE: keeps appointments   PHYSICAL EXAM:  BP 137/89   Pulse 70   Temp 98 F (36.7 C) (Tympanic)   Resp 16   Ht 4' 11 (1.499 m)   Wt 161 lb (73 kg)   BMI 32.52 kg/m  Lungs are clear to A&P cardiac examination essentially unremarkable with regular rate and rhythm. No dominant mass or nodularity is noted in either breast in 2 positions examined. Incision is well-healed. No axillary or supraclavicular adenopathy is appreciated. Cosmetic result is excellent.  Well-developed well-nourished patient in NAD. HEENT reveals PERLA, EOMI, discs not visualized.  Oral cavity is clear. No oral mucosal lesions are identified. Neck is clear without evidence of cervical or supraclavicular adenopathy. Lungs are clear to A&P. Cardiac examination is essentially unremarkable with regular rate and rhythm without murmur rub or thrill. Abdomen is benign with no organomegaly or masses noted. Motor sensory and DTR levels are equal and symmetric in the upper and lower extremities. Cranial nerves II through XII are grossly intact. Proprioception is intact. No peripheral adenopathy or edema is identified. No motor or  sensory levels are noted. Crude visual fields are within normal range.  RADIOLOGY RESULTS: No current films to review  PLAN: At the present time patient is doing well I encouraged her to increase her exercise to help her sleep better and to combat the fatigue.  I have otherwise asked to see her back in 6 months for follow-up.  Patient knows to call sooner with any concerns.  I would like to take this opportunity to thank you for allowing me to participate in the care of your patient.Marisa Marcey Penton, MD

## 2023-11-13 ENCOUNTER — Telehealth: Payer: Self-pay

## 2023-11-13 DIAGNOSIS — D0511 Intraductal carcinoma in situ of right breast: Secondary | ICD-10-CM

## 2023-11-13 NOTE — Telephone Encounter (Signed)
 Please schedule appt with Deland and inform patient of appt details.  Thanks!

## 2023-11-13 NOTE — Telephone Encounter (Signed)
 Patient called to schedule appt with Deland as previously discussed with Shasta HERO, nurse navigator, on 09/19/23.  I do not see a referral entered, one placed today.

## 2023-11-14 ENCOUNTER — Telehealth: Payer: Self-pay | Admitting: Oncology

## 2023-11-14 NOTE — Telephone Encounter (Signed)
 I called the pt to get her scheduled with Deland here at the cancer center and the pt states she would rather go to see Deland on [address] and she will wait for ana to call her next week to discuss it with her.

## 2023-11-20 NOTE — Telephone Encounter (Signed)
 Attempted to call Marisa Figueroa to discuss getting her scheduled with OT Deland.   No answer, VM full, unable to leave message.   Will attempt to call again to discuss.

## 2023-11-28 ENCOUNTER — Ambulatory Visit: Attending: Internal Medicine | Admitting: Occupational Therapy

## 2023-11-28 DIAGNOSIS — I972 Postmastectomy lymphedema syndrome: Secondary | ICD-10-CM | POA: Insufficient documentation

## 2023-11-28 DIAGNOSIS — L905 Scar conditions and fibrosis of skin: Secondary | ICD-10-CM | POA: Insufficient documentation

## 2023-11-28 DIAGNOSIS — N6489 Other specified disorders of breast: Secondary | ICD-10-CM | POA: Insufficient documentation

## 2023-11-28 DIAGNOSIS — D0511 Intraductal carcinoma in situ of right breast: Secondary | ICD-10-CM

## 2023-11-28 NOTE — Progress Notes (Signed)
 Survivorship Care Plan visit completed.  Treatment summary reviewed and given to patient.  ASCO answers booklet reviewed and given to patient.  CARE program and Cancer Transitions discussed with patient along with other resources cancer center offers to patients and caregivers.  Patient verbalized understanding.

## 2023-11-28 NOTE — Therapy (Signed)
 OUTPATIENT OCCUPATIONAL THERAPY TREATMENT NOTE/ RECERT  R breast Post-Mastectomy Lymphedema  Patient Name: Marisa Figueroa MRN: [id number] DOB:1970-01-25, 53 y.o., female Today's Date: 11/28/2023  END OF SESSION:  OT End of Session - 11/28/23 1828     Visit Number 3    Number of Visits 6    Date for Recertification  01/23/24    OT Start Time 0945    OT Stop Time 1012    OT Time Calculation (min) 27 min    Activity Tolerance Patient tolerated treatment well    Behavior During Therapy WFL for tasks assessed/performed            Past Medical History:  Diagnosis Date   Ductal carcinoma in situ (DCIS) of right breast    History of kidney stones    Hyperlipidemia    Past Surgical History:  Procedure Laterality Date   BREAST BIOPSY Right 04/19/2023   MM RT BREAST BX W LOC DEV 1ST LESION IMAGE BX SPEC STEREO GUIDE 04/19/2023 [facility]   BREAST BIOPSY Right 04/19/2023   MM RT BREAST BX W LOC DEV EA AD LESION IMG BX SPEC STEREO GUIDE 04/19/2023 [facility]   BREAST BIOPSY Right 06/20/2023   MM RT PLC BREAST LOC DEV   1ST LESION  INC MAMMO GUIDE 06/20/2023 [facility]   BREAST BIOPSY Right 07/04/2023   MM RT PLC BREAST LOC DEV   1ST LESION  INC STEREO GUIDE 07/04/2023 [facility]   BREAST LUMPECTOMY Right 07/04/2023   Procedure: BREAST LUMPECTOMY;  Surgeon: Rodolph Romano, MD;  Location: [facility];  Service: General;  Laterality: Right;  with needle localization   WISDOM TOOTH EXTRACTION     Patient Active Problem List   Diagnosis Date Noted   Ductal carcinoma in situ (DCIS) of right breast 05/11/2023   Nephrolithiasis 10/04/2022   Hyperlipemia 07/24/2018    PCP: Oneil Pinal, MD  REFERRING PROVIDER: Romano Rodolph, MD  REFERRING DIAG: 197.2  THERAPY DIAG:  Post-mastectomy lymphedema syndrome  Seroma of breast  Scar tissue  Rationale for Evaluation and Treatment: Rehabilitation  ONSET DATE: July 04, 2023  SUBJECTIVE:                                                                                                                                                                                            SUBJECTIVE STATEMENT: Marisa Figueroa returns to OT for lymphedema care to R breast, pain and scar tissue. Pt reports discomfort in R breast and chest wall  with seroma that was drained few times and cont scar tissue , tenderness and pain with over head ROM of R shoulder - initial OT evaluation was  on 08/29/23.   PERTINENT HISTORY:   PAIN:  Are you having pain?Pt very tender today - had seroma drained yesterday  PRECAUTIONS: Other: LYMPHEDEMA  WEIGHT BEARING RESTRICTIONS: No  FALLS:  Has patient fallen in last 6 months? No  LIVING ENVIRONMENT: Lives with: lives with their spouse Lives in: House/apartment  OCCUPATION: [place]  LEISURE: family  HAND DOMINANCE: left   PRIOR LEVEL OF FUNCTION: Independent  PATIENT GOALS: learn about lymphedema and Treatment   OBJECTIVE: Note: Objective measures were completed at Evaluation unless otherwise noted.  COGNITION:  Overall cognitive status: Within functional limits for tasks assessed   OBSERVATIONS / OTHER ASSESSMENTS:   POSTURE: guarding, R shoulder protracted  UE ROM: full, WNL, with grimacing at end range of shoulder flexion and abduction- pull over pect   LE MMT: WNL for tasks assessed  LYMPHEDEMA ASSESSMENTS:   SURGERY TYPE/DATE: 07/04/23 R lumpectomy  NUMBER OF LYMPH NODES REMOVED: 0 by patient report  CHEMOTHERAPY: none  RADIATION: Finished radiation 09/18/23  HORMONE TREATMENT: none to date  INFECTIONS: post surgical seroma w aspiration few times   LYMPHEDEMA ASSESSMENTS:    WIll do circumference of bilateral UE next visit as well L-Dex score in future                                                                                                     Patient arrived with reports of seroma on the lateral right breast was drained by Dr. Rodolph  yesterday.  Patient has bandage in place. Patient with fibrotic changes and very tender on superior breast. Some fibrotic changes in the lower half of the breast. Patient to hold off on any soft tissue and scar massage. Fabricated for patient to chip bag to initiate using using over fibrotic areas on right breast with light compression.  On and off during the day. Patient also to perform active assisted range of motion for bilateral shoulder flexion on wall 3 times a day 10 reps As well as right shoulder abduction active assisted range of motion the wall 10 reps. Followed by external rotation with gravity to relax 5 times hold 10 to 15 second as well as in supine gentle pec stretch with bilateral arms open with gravity for a light stretch.    ASSESSMENT:  CLINICAL IMPRESSION: Patient was seen by OT after surgery and recommended to obtain Juzo SOFT, size III, regular, ccl 1 sleeve with silicone top band. Internet resource provided. Pt and spouse participated in intro level edu for simple self MLD with good return at that visit-patient returned this date after she finished radiation treatment on 09/18/2023.  Patient developed a seroma that was drained several times since previous OT evaluation.  Patient reports continues increased tenderness,  discomfort and pain in the right breast, as well as increased swelling.  As well as continuous discomfort, tightness and pull with overhead R shoulder with abduction  and external rotation more than flexion.  (OT INITIAL EVALUATION 08/29/23: Marisa Figueroa is a 53 y.o. female presenting to Occupational Therapy with mild, stage I, RUE/RUQ lymphedema in the context of post-lumpectomy  radiation therapy. Swelling and skin changes are concentrated in the R breast and chest. Although comparative volumetrics are not performed until Rx visit 1, R trunk and arm swelling appear to be absent by gross visual assessment and palpation. Patient is wearing an off-the -shelf-, light  duty compression bra that does not constrict lymphatic pathways daily. AROM is WNL, but Pt grimaces at end ranges indicating pain skin stretch. In addition to swelling, Pt c/o R breast/ chest wall soreness and tightness, which limits basic and instrumental ADLs requiring reaching overhead, including dressing, grooming, putting groceries and dishes in cupboards.   Marisa Figueroa will benefit from skilled Occupational Therapy for lymphedema care. For those undergoing breast radiation, reducing lymphatic overload is important to manage swelling, reduce discomfort, and lower the risk of irreversible progression. OT strategies focus on encouraging proper lymphatic fluid drainage through exercise, manual massage, prophylactic compression, and lifestyle adjustments. In keeping with precautions,  OBJECTIVE IMPAIRMENTS: Abnormal gait, decreased activity tolerance, decreased knowledge of condition, decreased knowledge of use of DME, increased edema, pain, and breast swelling.   ACTIVITY LIMITATIONS: lifting, dressing, and hygiene/grooming  PARTICIPATION LIMITATIONS: none  PERSONAL FACTORS: Past/current experiences are also affecting patient's functional outcome.   REHAB POTENTIAL: Good  EVALUATION COMPLEXITY: Low   GOALS: Goals reviewed with patient? Yes  SHORT TERM GOALS: Target date: 6th OT visit  Pt will independent with lymphedema self manual lymphatic drainage (MLD) to limit lymphedema progression. Baseline:dependent Goal status: PROGRESSING  2.  Pt will demonstrate understanding of lymphedema precautions by being able to happy 5 examples to her daily activities to limit LE progression. Baseline: DEPENDENT Goal status:PROGRESSING   3.  Pt will be able to perform Lymphatic Pumping Therex using correct techniques to limit LE progression. Baseline: DEPENDENT Goal statusPROGRESSING  4.  Pt will obtain recommended prophylactic compression garments for R upper quadrant in keeping w lymphedema  precautions to limit progression. Baseline: DEPENDENT Goal status:PROGRESSING  5.  Pt will be able to don lymphedema compression garments with modified independence ( assistive device) and wear it in compliance with lymphedema precautions to limit LE progression Baseline: DEPENDANT Goal status: PROGRESSING PLAN:  OT FREQUENCY: 6 visits  PT DURATION: 6 weeks and PRN  PLANNED INTERVENTIONS: Teach Pt Lymphedema self-care home program :97110-Therapeutic exercises, 97530- Therapeutic activity, 97140- Manual therapy, Patient/Family education, Manual lymph drainage, and DME instructions  Deland du Kirke OTR/L,CLT 11/28/23 6:31 PM

## 2023-11-30 ENCOUNTER — Ambulatory Visit: Attending: General Surgery | Admitting: Occupational Therapy

## 2023-11-30 DIAGNOSIS — I972 Postmastectomy lymphedema syndrome: Secondary | ICD-10-CM | POA: Insufficient documentation

## 2023-11-30 DIAGNOSIS — L905 Scar conditions and fibrosis of skin: Secondary | ICD-10-CM | POA: Insufficient documentation

## 2023-11-30 DIAGNOSIS — N6489 Other specified disorders of breast: Secondary | ICD-10-CM | POA: Diagnosis present

## 2023-11-30 NOTE — Therapy (Signed)
 OUTPATIENT OCCUPATIONAL THERAPY TREATMENT NOTE/ RECERT  R breast Post-Mastectomy Lymphedema  Patient Name: Marisa Figueroa MRN: [id number] DOB:November 04, 1970, 53 y.o., female Today's Date: 11/30/2023  END OF SESSION:  OT End of Session - 11/30/23 1130     Visit Number 4    Number of Visits 6    Date for Recertification  01/23/24    OT Start Time 0915    OT Stop Time 0950    OT Time Calculation (min) 35 min    Activity Tolerance Patient tolerated treatment well    Behavior During Therapy WFL for tasks assessed/performed            Past Medical History:  Diagnosis Date   Ductal carcinoma in situ (DCIS) of right breast    History of kidney stones    Hyperlipidemia    Past Surgical History:  Procedure Laterality Date   BREAST BIOPSY Right 04/19/2023   MM RT BREAST BX W LOC DEV 1ST LESION IMAGE BX SPEC STEREO GUIDE 04/19/2023 [facility]   BREAST BIOPSY Right 04/19/2023   MM RT BREAST BX W LOC DEV EA AD LESION IMG BX SPEC STEREO GUIDE 04/19/2023 [facility]   BREAST BIOPSY Right 06/20/2023   MM RT PLC BREAST LOC DEV   1ST LESION  INC MAMMO GUIDE 06/20/2023 [facility]   BREAST BIOPSY Right 07/04/2023   MM RT PLC BREAST LOC DEV   1ST LESION  INC STEREO GUIDE 07/04/2023 [facility]   BREAST LUMPECTOMY Right 07/04/2023   Procedure: BREAST LUMPECTOMY;  Surgeon: Rodolph Romano, MD;  Location: [facility];  Service: General;  Laterality: Right;  with needle localization   WISDOM TOOTH EXTRACTION     Patient Active Problem List   Diagnosis Date Noted   Ductal carcinoma in situ (DCIS) of right breast 05/11/2023   Nephrolithiasis 10/04/2022   Hyperlipemia 07/24/2018    PCP: Oneil Pinal, MD  REFERRING PROVIDER: Romano Rodolph, MD  REFERRING DIAG: 197.2  THERAPY DIAG:  Post-mastectomy lymphedema syndrome  Seroma of breast  Scar tissue  Rationale for Evaluation and Treatment: Rehabilitation  ONSET DATE: July 04, 2023  SUBJECTIVE:                                                                                                                                                                                            SUBJECTIVE STATEMENT: Marisa Figueroa returns to OT for lymphedema care to R breast, pain and scar tissue. Pt reports discomfort in R breast and chest wall  with seroma that was drained few times and cont scar tissue , tenderness and pain with over head ROM of R shoulder - initial OT evaluation was  on 08/29/23.   PERTINENT HISTORY:   PAIN:  Are you having pain?Pt very tender today - had seroma drained earlier this week  PRECAUTIONS: Other: LYMPHEDEMA  WEIGHT BEARING RESTRICTIONS: No  FALLS:  Has patient fallen in last 6 months? No  LIVING ENVIRONMENT: Lives with: lives with their spouse Lives in: House/apartment  OCCUPATION: [place]  LEISURE: family  HAND DOMINANCE: left   PRIOR LEVEL OF FUNCTION: Independent  PATIENT GOALS: learn about lymphedema and Treatment   OBJECTIVE: Note: Objective measures were completed at Evaluation unless otherwise noted.  COGNITION:  Overall cognitive status: Within functional limits for tasks assessed   OBSERVATIONS / OTHER ASSESSMENTS:   POSTURE: guarding, R shoulder protracted  UE ROM: full, WNL, with grimacing at end range of shoulder flexion and abduction- pull over pect   LE MMT: WNL for tasks assessed  LYMPHEDEMA ASSESSMENTS:   SURGERY TYPE/DATE: 07/04/23 R lumpectomy  NUMBER OF LYMPH NODES REMOVED: 0 by patient report  CHEMOTHERAPY: none  RADIATION: Finished radiation 09/18/23  HORMONE TREATMENT: none to date  INFECTIONS: post surgical seroma w aspiration few times   LYMPHEDEMA ASSESSMENTS:     LYMPHEDEMA/ONCOLOGY QUESTIONNAIRE - 11/30/23 0001       Right Upper Extremity Lymphedema   15 cm Proximal to Olecranon Process 38 cm    10 cm Proximal to Olecranon Process 33.5 cm    Olecranon Process 26 cm    15 cm Proximal to Ulnar Styloid Process 25.5 cm    10 cm  Proximal to Ulnar Styloid Process 22.5 cm    Just Proximal to Ulnar Styloid Process 16 cm      Left Upper Extremity Lymphedema   15 cm Proximal to Olecranon Process 35 cm    10 cm Proximal to Olecranon Process 31.5 cm    Olecranon Process 27 cm    15 cm Proximal to Ulnar Styloid Process 26.2 cm    10 cm Proximal to Ulnar Styloid Process 22 cm    Just Proximal to Ulnar Styloid Process 15.5 cm         Will do L-Dex score in future                                                                                                     Patient arrived with reports of seroma on the lateral right breast was drained by Dr. Rodolph earlier this week  Patient with fibrotic changes and very tender on superior breast. Some fibrotic changes in the lower half of the breast. Patient to hold off on any soft tissue and scar massage. Pt  to cont to use chip bag to decrease fibrotic areas on right breast with light compression.  On and off during the day. Pt ed on unilateral post mastectomy pad use at  night time -for lympatic flow in breast and thoracic Circumference measurements increase in R upper arm -  Pt is L hand dominant Pt do have preventitive compression sleeve for R UE -  Pt to wear with high risk activities -  Pt ed on signs, symptoms and prevention of lymphedema- will  provide her hand out next visit that was in cancer journal    Patient to cont to perform active assisted range of motion for bilateral shoulder flexion on wall 3 times a day 10 reps As well as right shoulder abduction active assisted range of motion the wall 10 reps. Followed by external rotation with gravity to relax 5 times hold 10 to 15 second as well as in supine gentle pec stretch with bilateral arms open with gravity for a light stretch.    ASSESSMENT:  CLINICAL IMPRESSION: Patient was seen by OT after surgery and recommended to obtain Juzo SOFT, size III, regular, ccl 1 sleeve with silicone top band. Internet  resource provided. Pt and spouse participated in intro level edu for simple self MLD with good return at that visit-patient returned this date after she finished radiation treatment on 09/18/2023.  Patient developed a seroma that was drained several times since previous OT evaluation.  Patient reports continues increased tenderness,  discomfort and pain in the right breast, as well as increased swelling.  As well as continuous discomfort, tightness and pull with overhead R shoulder with abduction  and external rotation more than flexion.Pt to cont to use chip bag and recommended this session unilateral postmastectomy jovipak to use to decrease lymphedema in R breast and thoracic - pt circumference increase compare to L dominant UE- recommend for pt to use jovipak for about 2 wks night time and cont chipbag and follow up   (OT INITIAL EVALUATION 08/29/23: Jurni Cesaro is a 53 y.o. female presenting to Occupational Therapy with mild, stage I, RUE/RUQ lymphedema in the context of post-lumpectomy radiation therapy. Swelling and skin changes are concentrated in the R breast and chest. Although comparative volumetrics are not performed until Rx visit 1, R trunk and arm swelling appear to be absent by gross visual assessment and palpation. Patient is wearing an off-the -shelf-, light duty compression bra that does not constrict lymphatic pathways daily. AROM is WNL, but Pt grimaces at end ranges indicating pain skin stretch. In addition to swelling, Pt c/o R breast/ chest wall soreness and tightness, which limits basic and instrumental ADLs requiring reaching overhead, including dressing, grooming, putting groceries and dishes in cupboards.   Chantrice Hagg will benefit from skilled Occupational Therapy for lymphedema care. For those undergoing breast radiation, reducing lymphatic overload is important to manage swelling, reduce discomfort, and lower the risk of irreversible progression. OT strategies focus on  encouraging proper lymphatic fluid drainage through exercise, manual massage, prophylactic compression, and lifestyle adjustments. In keeping with precautions,  OBJECTIVE IMPAIRMENTS: Abnormal gait, decreased activity tolerance, decreased knowledge of condition, decreased knowledge of use of DME, increased edema, pain, and breast swelling.   ACTIVITY LIMITATIONS: lifting, dressing, and hygiene/grooming  PARTICIPATION LIMITATIONS: none  PERSONAL FACTORS: Past/current experiences are also affecting patient's functional outcome.   REHAB POTENTIAL: Good  EVALUATION COMPLEXITY: Low   GOALS: Goals reviewed with patient? Yes  SHORT TERM GOALS: Target date: 6th OT visit  Pt will independent with lymphedema self manual lymphatic drainage (MLD) to limit lymphedema progression. Baseline:dependent Goal status: PROGRESSING  2.  Pt will demonstrate understanding of lymphedema precautions by being able to happy 5 examples to her daily activities to limit LE progression. Baseline: DEPENDENT Goal status:PROGRESSING   3.  Pt will be able to perform Lymphatic Pumping Therex using correct techniques to limit LE progression. Baseline: DEPENDENT Goal statusPROGRESSING  4.  Pt will obtain recommended prophylactic compression garments for R upper quadrant in keeping w lymphedema  precautions to limit progression. Baseline: DEPENDENT Goal status:PROGRESSING  5.  Pt will be able to don lymphedema compression garments with modified independence ( assistive device) and wear it in compliance with lymphedema precautions to limit LE progression Baseline: DEPENDANT Goal status: PROGRESSING PLAN:  OT FREQUENCY: 6 visits  PT DURATION: 6 weeks and PRN  PLANNED INTERVENTIONS: Teach Pt Lymphedema self-care home program :97110-Therapeutic exercises, 97530- Therapeutic activity, 97140- Manual therapy, Patient/Family education, Manual lymph drainage, and DME instructions  Deland du Preez OTR/L,CLT 11/30/23  11:42 AM

## 2023-12-18 ENCOUNTER — Encounter: Payer: Self-pay | Admitting: Oncology

## 2023-12-18 ENCOUNTER — Inpatient Hospital Stay: Admitting: Oncology

## 2023-12-18 ENCOUNTER — Inpatient Hospital Stay: Attending: Oncology | Admitting: Oncology

## 2023-12-18 VITALS — BP 128/98 | HR 77 | Temp 98.9°F | Resp 18 | Ht 59.0 in | Wt 158.0 lb

## 2023-12-18 DIAGNOSIS — F17210 Nicotine dependence, cigarettes, uncomplicated: Secondary | ICD-10-CM | POA: Diagnosis not present

## 2023-12-18 DIAGNOSIS — D0511 Intraductal carcinoma in situ of right breast: Secondary | ICD-10-CM | POA: Insufficient documentation

## 2023-12-18 DIAGNOSIS — I890 Lymphedema, not elsewhere classified: Secondary | ICD-10-CM | POA: Insufficient documentation

## 2023-12-18 NOTE — Progress Notes (Unsigned)
 [facility]  Telephone:([phone]) [phone] Fax:(336) [phone]  ID: Marisa Figueroa OB: 09-Mar-1970  MR#: [id number]  RDW#:[id number]  Patient Care Team: Cleotilde Oneil FALCON, MD as PCP - General (Internal Medicine) Georgina Shasta POUR, RN as Oncology Nurse Navigator Jacobo, Evalene PARAS, MD as Consulting Physician (Oncology) Rodolph Romano, MD as Consulting Physician (General Surgery) Lenn Aran, MD as Consulting Physician (Radiation Oncology)  CHIEF COMPLAINT: DCIS right breast.  INTERVAL HISTORY: Patient returns to clinic today for further evaluation at the conclusion of her XRT.  She has mild breast tenderness, but otherwise feels well.  She has no neurologic complaints.  She denies any recent fevers or illnesses.  She has a good appetite and denies weight loss.  She has no chest pain, shortness of breath, cough, or hemoptysis.  She denies any nausea, vomiting, constipation, or diarrhea.  She has no urinary complaints.  Patient offers no further specific complaints today.  REVIEW OF SYSTEMS:   Review of Systems  Constitutional: Negative.  Negative for fever, malaise/fatigue and weight loss.  Respiratory: Negative.  Negative for cough, hemoptysis and shortness of breath.   Cardiovascular: Negative.  Negative for chest pain and leg swelling.  Gastrointestinal: Negative.  Negative for abdominal pain.  Genitourinary: Negative.  Negative for dysuria.  Musculoskeletal: Negative.  Negative for back pain.  Skin: Negative.  Negative for rash.  Neurological: Negative.  Negative for dizziness, focal weakness, weakness and headaches.  Psychiatric/Behavioral: Negative.  The patient is not nervous/anxious.     As per HPI. Otherwise, a complete review of systems is negative.  PAST MEDICAL HISTORY: Past Medical History:  Diagnosis Date  . Ductal carcinoma in situ (DCIS) of right breast   . History of kidney stones   . Hyperlipidemia     PAST SURGICAL HISTORY: Past Surgical  History:  Procedure Laterality Date  . BREAST BIOPSY Right 04/19/2023   MM RT BREAST BX W LOC DEV 1ST LESION IMAGE BX SPEC STEREO GUIDE 04/19/2023 [facility]  . BREAST BIOPSY Right 04/19/2023   MM RT BREAST BX W LOC DEV EA AD LESION IMG BX SPEC STEREO GUIDE 04/19/2023 [facility]  . BREAST BIOPSY Right 06/20/2023   MM RT PLC BREAST LOC DEV   1ST LESION  INC MAMMO GUIDE 06/20/2023 [facility]  . BREAST BIOPSY Right 07/04/2023   MM RT PLC BREAST LOC DEV   1ST LESION  INC STEREO GUIDE 07/04/2023 [facility]  . BREAST LUMPECTOMY Right 07/04/2023   Procedure: BREAST LUMPECTOMY;  Surgeon: Rodolph Romano, MD;  Location: [facility];  Service: General;  Laterality: Right;  with needle localization  . WISDOM TOOTH EXTRACTION      FAMILY HISTORY: Family History  Problem Relation Age of Onset  . Melanoma Mother        metastatic  . Prostate cancer Father   . Ovarian cancer Paternal Aunt   . ALS Paternal Grandfather     ADVANCED DIRECTIVES (Y/N):  N  HEALTH MAINTENANCE: Social History   Tobacco Use  . Smoking status: Light Smoker    Current packs/day: 0.14    Average packs/day: 0.1 packs/day for 20.0 years (2.8 ttl pk-yrs)    Types: Cigarettes  . Smokeless tobacco: Never  Vaping Use  . Vaping status: Never Used  Substance Use Topics  . Alcohol use: Yes    Comment: social  . Drug use: Never     Colonoscopy:  PAP:  Bone density:  Lipid panel:  Allergies  Allergen Reactions  . Lactose Intolerance (Gi)     Upsets  stomach  . Penicillins Itching  . Simvastatin Other (See Comments)    Unknown reaction   . Sulfa  Antibiotics   . Epinephrine  Palpitations    Current Outpatient Medications  Medication Sig Dispense Refill  . acetaminophen  (TYLENOL ) 500 MG tablet Take 500 mg by mouth every 6 (six) hours as needed for moderate pain (pain score 4-6).    SABRA albuterol (VENTOLIN HFA) 108 (90 Base) MCG/ACT inhaler Inhale 1-2 puffs into the lungs every 6 (six) hours as  needed for wheezing or shortness of breath (FOR ALLERGIES ONLY).    . Coenzyme Q10 (CO Q 10 PO) Take 1 tablet by mouth daily at 6 (six) AM.    . cyanocobalamin (VITAMIN B12) 1000 MCG tablet Take 1,000 mcg by mouth daily.    SABRA ketoconazole (NIZORAL) 2 % shampoo Apply 1 Application topically once a week.    . Moringa Oleifera (MORINGA PO) Take 2 tablets by mouth daily.    . Multiple Vitamin (MULTIVITAMIN WITH MINERALS) TABS tablet Take 1 tablet by mouth daily.    . rosuvastatin (CRESTOR) 10 MG tablet Take 10 mg by mouth at bedtime.    . Vitamin D-Vitamin K (VITAMIN K2-VITAMIN D3 PO) Take 1 capsule by mouth daily.    SABRA VITAMIN E PO Take 1 tablet by mouth daily.    . diphenhydrAMINE (BENADRYL) 25 MG tablet Take 25 mg by mouth every 6 (six) hours as needed for allergies. (Patient not taking: Reported on 12/18/2023)    . ivermectin (STROMECTOL) 3 MG TABS tablet Take 150 mcg/kg by mouth. (Patient not taking: Reported on 12/18/2023)    . OVER THE COUNTER MEDICATION Take 4 drops by mouth daily. Silver water  (Patient not taking: Reported on 12/18/2023)    . oxyCODONE -acetaminophen  (PERCOCET) 5-325 MG tablet Take 1 tablet by mouth every 4 (four) hours as needed for severe pain (pain score 7-10). (Patient not taking: Reported on 12/18/2023) 20 tablet 0   No current facility-administered medications for this visit.    OBJECTIVE: Vitals:   12/18/23 1522  BP: (!) 128/98  Pulse: 77  Resp: 18  Temp: 98.9 F (37.2 C)  SpO2: 98%     Body mass index is 31.91 kg/m.    ECOG FS:0 - Asymptomatic  General: Well-developed, well-nourished, no acute distress. Eyes: Pink conjunctiva, anicteric sclera. HEENT: Normocephalic, moist mucous membranes. Lungs: No audible wheezing or coughing. Heart: Regular rate and rhythm. Abdomen: Soft, nontender, no obvious distention. Musculoskeletal: No edema, cyanosis, or clubbing. Neuro: Alert, answering all questions appropriately. Cranial nerves grossly intact. Skin: No rashes  or petechiae noted. Psych: Normal affect.  LAB RESULTS:  Lab Results  Component Value Date   NA 136 08/05/2022   K 3.4 (L) 08/05/2022   CL 104 08/05/2022   CO2 21 (L) 08/05/2022   GLUCOSE 108 (H) 08/05/2022   BUN 14 08/05/2022   CREATININE 0.75 08/05/2022   CALCIUM 9.1 08/05/2022   PROT 7.5 08/05/2022   ALBUMIN 4.6 08/05/2022   AST 21 08/05/2022   ALT 15 08/05/2022   ALKPHOS 61 08/05/2022   BILITOT 0.6 08/05/2022   GFRNONAA >60 08/05/2022    Lab Results  Component Value Date   WBC 6.6 08/20/2023   HGB 13.9 08/20/2023   HCT 40.4 08/20/2023   MCV 85.1 08/20/2023   PLT 255 08/20/2023     STUDIES: No results found.   ASSESSMENT: DCIS right breast.  PLAN:    DCIS right breast: Imaging and pathology reviewed independently.  Patient also had a breast MRI  on May 18, 2023.  No pathologically enlarged lymph nodes were noted.  Lumpectomy completed on July 04, 2023 confirmed diagnosis.  Patient did not require adjuvant chemotherapy.  She completed adjuvant XRT on September 18, 2023.  She declined initiating tamoxifen  at this time despite acknowledging the increased risk of recurrence.  No further intervention is needed.  Patient return to clinic in 3 months for further evaluation.  If she continues to refuse tamoxifen  at that time, she likely can be discharged from clinic.   Left breast cellulitis: Resolved.  I spent a total of 30 minutes reviewing chart data, face-to-face evaluation with the patient, counseling and coordination of care as detailed above.  Patient expressed understanding and was in agreement with this plan. She also understands that She can call clinic at any time with any questions, concerns, or complaints.    Cancer Staging  Ductal carcinoma in situ (DCIS) of right breast Staging form: Breast, AJCC 8th Edition - Clinical stage from 05/23/2023: Stage 0 (cTis (DCIS), cN0, cM0, G2, ER+, PR+, HER2-) - Signed by Jacobo Evalene PARAS, MD on 05/23/2023 Stage prefix:  Initial diagnosis Histologic grading system: 3 grade system   Evalene PARAS Jacobo, MD   12/18/2023 3:32 PM

## 2023-12-19 ENCOUNTER — Encounter: Payer: Self-pay | Admitting: *Deleted

## 2024-01-17 ENCOUNTER — Encounter: Payer: Self-pay | Admitting: *Deleted

## 2024-01-17 NOTE — Progress Notes (Signed)
"  ° °  History of Present Illness Marisa Figueroa is a 54 year old female with recurrent right breast seroma and fibrosis following lumpectomy and adjuvant radiation for DCIS who presents for follow-up and management of persistent seroma.  She developed a recurrent right breast seroma following lumpectomy for DCIS on June 25th, 2035, and completion of adjuvant radiation therapy on September 9th, 2035. She did not receive adjuvant chemotherapy and declined tamoxifen .  The seroma has required multiple aspirations due to repeated recurrences. She prefers partial rather than complete aspiration, expressing concern that complete aspiration may affect the tissue.  She describes significant tightness and fibrosis in the area, which she attributes to both radiation and fluid accumulation. She has been massaging the area to alleviate symptoms. The bulkiness of the seroma has prevented her from wearing her usual night garment with Velcro closure.   PAST SURGICAL HISTORY:   Past Surgical History:  Procedure Laterality Date   COLONOSCOPY  01/31/2021   Normal/Rpt27yrs/TKT   MASTECTOMY PARTIAL Right 07/04/2023   Dr Lucas Catchings -- w/ NL (brackett)         PHYSICAL EXAM:  Vitals:   01/17/24 1551  BP: (!) 142/88  Pulse: 66  .  Ht:149.9 cm (4' 11) Wt:70.3 kg (155 lb) ADJ:Anib surface area is 1.71 meters squared. Body mass index is 31.31 kg/m.SABRA   GENERAL: Alert, active, oriented x3  BREAST: Right breast palpable seroma, recurrent.  No overlying sign of infection.   Assessment & Plan Recurrent right breast seroma She has a recurrent right breast seroma following lumpectomy and adjuvant radiation for DCIS, requiring multiple aspirations due to recurrence. The seroma volume is decreasing but remains symptomatic, causing discomfort and interfering with her ability to wear compression garments. There is no evidence of infection. Partial aspiration was chosen to reduce symptoms while minimizing  tissue disruption, with the understanding that fluid may reaccumulate and repeated aspirations may be necessary. Aspirated 160 cc of serous fluid from the right breast seroma under sterile conditions. Assessed for signs of infection; none present. Scheduled follow-up in two weeks to evaluate for seroma reaccumulation.  Right breast fibrosis and scar tissue She has significant fibrosis and tightness in the right breast secondary to prior radiation therapy and seroma formation, resulting in discomfort and limiting her ability to wear certain garments. This is a chronic sequela of her prior treatments. Recommended massage of the affected area to address fibrosis. Provided anticipatory guidance regarding the chronic nature of fibrosis and its association with prior radiation and seroma formation.  Seroma of breast [N64.89]  Patient and her husband verbalized understanding, all questions were answered, and were agreeable with the plan outlined above.   Lucas Sjogren, MD "

## 2024-01-17 NOTE — Progress Notes (Signed)
 Acupuncture approval sent to [facility].

## 2024-01-23 NOTE — Progress Notes (Signed)
" ° °  01/24/2024 3:29 PM   Marisa Figueroa 1970/10/13 [id number]  Reason for visit: Follow up nephrolithiasis   HPI: 54 y.o. female, initial follow up with me today, previously seen by PA McGowan in July 2025  3-4-day history of left flank pain, left lower abdominal pain + Urinary urgency, frequency, pressure Feels similar to prior stone event No nausea vomiting, no subjective fever She has been taking Percocet and tamsulosin  at home  Prior HPI: Hx of nephrolithiasis  - prior spontaneous passage 2024  - stone comp 100% CaOX di  - KUB (July 2025) - 4mm left renal stone     Physical Exam: BP (!) 146/90   Pulse 79   Ht 5' (1.524 m)   Wt 155 lb (70.3 kg)   BMI 30.27 kg/m    Constitutional:  Alert and oriented, No acute distress.  Laboratory Data: UA today -possible developing UTI, positive pyuria and blood  Pertinent Imaging: I have personally viewed and interpreted the KUB (01/24/24) -several small 2-3 mm calcifications within the left hemipelvis, likely close to the course of the left ureter.  Unclear if ureteral stones versus phleboliths.  The 2 most inferior calcifications appear stable from KUB in July 2025    Assessment & Plan:    Nephrolithiasis Assessment & Plan: Hx of nephrolithiasis  - passed stone in 2024  - stone comp 100% CaOX di  - KUB (July 2025) - 4mm left renal stone  Possibly new Left sided stone event, x 3-4 days, +UTI symptoms KUB today -a couple of left hemipelvic calcifications noted.  The 2 inferior calcifications appear to be phleboliths and stable when compared to July.  Unclear if superior calcification is a ureteral stone  - Plan for CT stone to further characterize - Will write for additional Flomax , Toradol  10 mg, Pyridium , additional Percocet - Macrobid  x 7 days for possible developing UTI - Urine for culture - Urine strainer - RTC once CT complete   Orders: -     Urinalysis, Complete -     Tamsulosin  HCl; Take 1 capsule (0.4 mg  total) by mouth daily.  Dispense: 90 capsule; Refill: 3 -     Ketorolac  Tromethamine ; Take 1 tablet (10 mg total) by mouth every 6 (six) hours as needed.  Dispense: 20 tablet; Refill: 0 -     Phenazopyridine  HCl; Take 1 tablet (200 mg total) by mouth daily.  Dispense: 5 tablet; Refill: 0 -     CULTURE, URINE COMPREHENSIVE -     CT RENAL STONE STUDY; Future -     Nitrofurantoin  Monohyd Macro; Take 1 capsule (100 mg total) by mouth every 12 (twelve) hours.  Dispense: 14 capsule; Refill: 0 -     oxyCODONE -Acetaminophen ; Take 1 tablet by mouth every 4 (four) hours as needed for up to 5 days for severe pain (pain score 7-10).  Dispense: 7 tablet; Refill: 0       Marisa JONELLE Skye, MD  [facility] [address], [address] [place], [place] [address] [phone] "

## 2024-01-23 NOTE — Assessment & Plan Note (Addendum)
 Hx of nephrolithiasis  - passed stone in 2024  - stone comp 100% CaOX di  - KUB (July 2025) - 4mm left renal stone  Possibly new Left sided stone event, x 3-4 days, +UTI symptoms KUB today -a couple of left hemipelvic calcifications noted.  The 2 inferior calcifications appear to be phleboliths and stable when compared to July.  Unclear if superior calcification is a ureteral stone  - Plan for CT stone to further characterize - Will write for additional Flomax , Toradol  10 mg, Pyridium , additional Percocet - Macrobid  x 7 days for possible developing UTI - Urine for culture - Urine strainer - RTC once CT complete

## 2024-01-24 ENCOUNTER — Other Ambulatory Visit: Payer: Self-pay

## 2024-01-24 ENCOUNTER — Ambulatory Visit
Admission: RE | Admit: 2024-01-24 | Discharge: 2024-01-24 | Disposition: A | Payer: Self-pay | Source: Ambulatory Visit | Attending: Urology

## 2024-01-24 ENCOUNTER — Ambulatory Visit (INDEPENDENT_AMBULATORY_CARE_PROVIDER_SITE_OTHER): Payer: Self-pay | Admitting: Urology

## 2024-01-24 ENCOUNTER — Ambulatory Visit
Admission: RE | Admit: 2024-01-24 | Discharge: 2024-01-24 | Disposition: A | Discharge status: Home / Self Care | Payer: Self-pay | Attending: Urology | Admitting: Urology

## 2024-01-24 VITALS — BP 146/90 | HR 79 | Ht 60.0 in | Wt 155.0 lb

## 2024-01-24 DIAGNOSIS — N200 Calculus of kidney: Secondary | ICD-10-CM

## 2024-01-24 DIAGNOSIS — Z87442 Personal history of urinary calculi: Secondary | ICD-10-CM

## 2024-01-24 LAB — URINALYSIS, COMPLETE
Bilirubin, UA: NEGATIVE
Glucose, UA: NEGATIVE
Ketones, UA: NEGATIVE
Nitrite, UA: NEGATIVE
Protein,UA: NEGATIVE
Specific Gravity, UA: <1.005 — ABNORMAL LOW (ref 1.005–1.030)
Urobilinogen, Ur: 0.2 mg/dL (ref 0.2–1.0)
pH, UA: 6 (ref 5.0–7.5)

## 2024-01-24 LAB — MICROSCOPIC EXAMINATION: WBC, UA: >30 /HPF — AB (ref 0–5)

## 2024-01-24 MED ORDER — OXYCODONE-ACETAMINOPHEN 5-325 MG PO TABS: 1.0000 | ORAL_TABLET | ORAL | 0 refills | Status: AC | PRN

## 2024-01-24 MED ORDER — KETOROLAC TROMETHAMINE 10 MG PO TABS: 10.0000 mg | ORAL_TABLET | Freq: Four times a day (QID) | ORAL | 0 refills | Status: AC | PRN

## 2024-01-24 MED ORDER — PHENAZOPYRIDINE HCL 200 MG PO TABS: 200.0000 mg | ORAL_TABLET | Freq: Every day | ORAL | 0 refills | Status: AC

## 2024-01-24 MED ORDER — NITROFURANTOIN MONOHYD MACRO 100 MG PO CAPS: 100.0000 mg | ORAL_CAPSULE | Freq: Two times a day (BID) | ORAL | 0 refills | Status: AC

## 2024-01-24 MED ORDER — TAMSULOSIN HCL 0.4 MG PO CAPS: 0.4000 mg | ORAL_CAPSULE | Freq: Every day | ORAL | 3 refills | Status: AC

## 2024-01-24 NOTE — Patient Instructions (Signed)
 Please call [phone] to schedule your imaging prior to your appointment. Please allow time for your imaging results as this can take up 2-3 weeks to review. A follow up appointment as already been scheduled for the doctor to review results with you.

## 2024-01-29 LAB — CULTURE, URINE COMPREHENSIVE

## 2024-01-31 ENCOUNTER — Ambulatory Visit
Admission: RE | Admit: 2024-01-31 | Discharge: 2024-01-31 | Disposition: A | Discharge status: Home / Self Care | Payer: Self-pay | Source: Ambulatory Visit | Attending: Urology | Admitting: Urology

## 2024-01-31 DIAGNOSIS — N200 Calculus of kidney: Secondary | ICD-10-CM | POA: Diagnosis present

## 2024-02-14 ENCOUNTER — Ambulatory Visit
Admission: RE | Admit: 2024-02-14 | Discharge: 2024-02-14 | Disposition: A | Source: Ambulatory Visit | Attending: Urology | Admitting: Urology

## 2024-02-14 ENCOUNTER — Ambulatory Visit: Payer: Self-pay | Admitting: Urology

## 2024-02-14 VITALS — BP 138/82 | HR 74 | Ht 60.0 in | Wt 150.0 lb

## 2024-02-14 DIAGNOSIS — N200 Calculus of kidney: Secondary | ICD-10-CM

## 2024-03-17 ENCOUNTER — Ambulatory Visit: Admitting: Radiation Oncology

## 2024-08-01 ENCOUNTER — Ambulatory Visit: Admitting: Urology
